# Patient Record
Sex: Female | Born: 1975 | Race: White | Hispanic: No | Marital: Married | Smoking: Never smoker
Health system: Southern US, Community
[De-identification: ages and names within clinical notes are randomized; demographics above are authoritative.]

---
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
## 2009-09-13 ENCOUNTER — Ambulatory Visit: Payer: Self-pay | Admitting: Family Medicine

## 2009-09-13 ENCOUNTER — Encounter: Admission: RE | Admit: 2009-09-13 | Discharge: 2009-09-13 | Payer: Self-pay | Admitting: Family Medicine

## 2009-09-13 DIAGNOSIS — R079 Chest pain, unspecified: Secondary | ICD-10-CM

## 2010-01-17 ENCOUNTER — Encounter: Payer: Self-pay | Admitting: Family Medicine

## 2010-02-06 NOTE — Assessment & Plan Note (Addendum)
Summary: NOV: Lump on right chest   Vital Signs:  Patient profile:   35 year old female Height:      65 inches Weight:      109 pounds BMI:     18.20 Pulse rate:   96 / minute BP sitting:   105 / 66  (right arm) Cuff size:   regular  Vitals Entered By: Avon Gully CMA, (AAMA) (September 13, 2009 11:00 AM) CC: NP est care   CC:  NP est care.  History of Present Illness: Her efor CPE. Has had a lump on her right rib that has been there for years.  Has noticed some occ pain in that area. No change in size or shape.  Has a baby 9 months ago and had normal labs at her 6 week postpartum check up.  Still nursing. On camila for birth control. No other lumps or rashes.  She had a friend get dx with lyphoma recently and this has made her more nervous about it.   Habits & Providers  Alcohol-Tobacco-Diet     Alcohol drinks/day: <1     Tobacco Status: never  Exercise-Depression-Behavior     Does Patient Exercise: no     STD Risk: never     Drug Use: no     Seat Belt Use: always  Past History:  Past Medical History: [facility]  Past Surgical History: Wisdom teeth removal 1997  Family History: MGM with thyroid CA Mother with derpession GM with hi chol HTN Mother with HTN  Social History: Homemaker. BA degree. Married to Loraine wtih 3 duaghters.  Never Smoked Alcohol use-yes Drug use-no Regular exercise-no Smoking Status:  never Does Patient Exercise:  no STD Risk:  never Drug Use:  no Seat Belt Use:  always  Review of Systems       No fever/sweats/weakness, unexplained weight loss/gain.  No vison changes.  No difficulty hearing/ringing in ears, hay fever/allergies.  No chest pain/discomfort, palpitations.  No Br lump/nipple discharge.  No cough/wheeze.  No blood in BM, nausea/vomiting/diarrhea.  No nighttime urination, leaking urine, unusual vaginal bleeding, discharge (penis or vagina).  No muscle/joint pain. No rash, change in mole.  No HA, memory loss.  No  anxiety, sleep d/o, depression.  No easy bruising/bleeding, + unexplained lump   Physical Exam  General:  Well-developed,well-nourished,in no acute distress; alert,appropriate and cooperative throughout examination Head:  Normocephalic and atraumatic without obvious abnormalities. No apparent alopecia or balding. Eyes:  No corneal or conjunctival inflammation noted. Abdomen:  soft, no masses, no guarding, no hepatomegaly, and no splenomegaly.  Does have a slight protrusion over the right rib area. It is not mobile and I really think it is the rib protruding. I don't feel a discrete mass like a LN or lipoma.     Impression & Recommendations:  Problem # 1:  CHEST PAIN (ICD-786.50) Will get xray to look at the rib area. conisder poorly healed rib fracture versus just normal anatomy or clacium build up. I really don't feel any soft tissue mass. If xray normal then recommend to monitor for change in size, shape or discomfort.  Orders: T-*Unlisted Diagnostic X-ray test/procedure [phone])  Other Orders: Flu Vaccine 83yrs + (52841) Admin 1st Vaccine (32440)   Immunizations Administered:  Influenza Vaccine # 1:    Vaccine Type: Fluvax 3+    Site: right deltoid    Mfr: Fluarix    Dose: 0.5 ml    Route: IM    Given by: Avon Gully  CMA, (AAMA)    Exp. Date: 07/07/2010    Lot #: [id number]    VIS given: 07/31/06 version given September 13, 2009.  Flu Vaccine Consent Questions:    Do you have a history of severe allergic reactions to this vaccine? no    Any prior history of allergic reactions to egg and/or gelatin? no    Do you have a sensitivity to the preservative Thimersol? no    Do you have a past history of Guillan-Barre Syndrome? no    Do you currently have an acute febrile illness? no    Have you ever had a severe reaction to latex? no    Vaccine information given and explained to patient? no    Are you currently pregnant? no   Appended Document: NOV: Lump on right chest Pap  smear normal - to return for  PAP in one year

## 2010-02-08 NOTE — Letter (Signed)
Summary: [facility]  [facility]   Imported By: Lanelle Bal 02/01/2010 10:31:14  _____________________________________________________________________  External Attachment:    Type:   Image     Comment:   External Document

## 2011-08-02 ENCOUNTER — Encounter: Payer: Self-pay | Admitting: Family Medicine

## 2011-08-02 ENCOUNTER — Ambulatory Visit (INDEPENDENT_AMBULATORY_CARE_PROVIDER_SITE_OTHER): Payer: Self-pay | Admitting: Family Medicine

## 2011-08-02 VITALS — BP 112/73 | HR 87 | Ht 65.0 in | Wt 114.0 lb

## 2011-08-02 DIAGNOSIS — Z0000 Encounter for general adult medical examination without abnormal findings: Secondary | ICD-10-CM

## 2011-08-02 DIAGNOSIS — Z23 Encounter for immunization: Secondary | ICD-10-CM

## 2011-08-02 NOTE — Patient Instructions (Addendum)
Start a regular exercise program and make sure you are eating a healthy diet Try to eat 4 servings of dairy a day  Your vaccines are up to date.   

## 2011-08-02 NOTE — Progress Notes (Signed)
  Subjective:    Patient ID: Marcia Lara, female    DOB: Feb 09, 1975, 36 y.o.   MRN: [id number]  HPI Here for CPE. No specific concerns.  Normal pap smears at [facility].  She is up-to-date on this. She does not note her last shot was. Her only prescription medication is birth control.   Review of Systems Comprehensive ROS is negative.   BP 112/73  Pulse 87  Ht 5\' 5"  (1.651 m)  Wt 114 lb (51.71 kg)  BMI 18.97 kg/m2  SpO2 100%    No Known Allergies  History reviewed. No pertinent past medical history.  Past Surgical History  Procedure Date  . Wisdom tooth extraction 1997    History   Social History  . Marital Status: Married    Spouse Name: N/A    Number of Children: N/A  . Years of Education: N/A   Occupational History  . Not on file.   Social History Main Topics  . Smoking status: Never Smoker   . Smokeless tobacco: Not on file  . Alcohol Use: Yes  . Drug Use: No  . Sexually Active: Not on file   Other Topics Concern  . Not on file   Social History Narrative  . No narrative on file    Family History  Problem Relation Age of Onset  . Thyroid cancer Maternal Grandmother   . Depression Mother   . Hypertension    . Hyperlipidemia      Outpatient Encounter Prescriptions as of 08/02/2011  Medication Sig Dispense Refill  . Norgestim-Eth Estrad Triphasic (TRI-SPRINTEC PO) Take by mouth.              Objective:   Physical Exam  BP 112/73  Pulse 87  Ht 5\' 5"  (1.651 m)  Wt 114 lb (51.71 kg)  BMI 18.97 kg/m2  SpO2 100% General appearance: alert, cooperative and appears stated age Head: Normocephalic, without obvious abnormality, atraumatic Eyes: conj clear, EOMi, PEERLA Ears: normal TM's and external ear canals both ears Nose: Nares normal. Septum midline. Mucosa normal. No drainage or sinus tenderness. Throat: lips, mucosa, and tongue normal; teeth and gums normal Neck: no adenopathy, no carotid bruit, no JVD, supple, symmetrical,  trachea midline and thyroid not enlarged, symmetric, no tenderness/mass/nodules Back: symmetric, no curvature. ROM normal. No CVA tenderness. Lungs: clear to auscultation bilaterally Heart: regular rate and rhythm, S1, S2 normal, no murmur, click, rub or gallop Abdomen: soft, non-tender; bowel sounds normal; no masses,  no organomegaly Extremities: extremities normal, atraumatic, no cyanosis or edema Pulses: 2+ and symmetric Skin: Skin color, texture, turgor normal. No rashes or lesions Lymph nodes: Cervical, supraclavicular, and axillary nodes normal. Neurologic: Alert and oriented X 3, normal strength and tone. Normal symmetric reflexes. Normal coordination and gait        Assessment & Plan:  CPE- BP looks great.   Start a regular exercise program and make sure you are eating a healthy diet Try to eat 4 servings of dairy a day  Your vaccines are up to date.  Due for fasting labwork.   Tdap given today.

## 2011-08-05 ENCOUNTER — Telehealth: Payer: Self-pay | Admitting: Family Medicine

## 2011-08-05 LAB — LIPID PANEL: Total CHOL/HDL Ratio: 2.6 Ratio

## 2011-08-05 LAB — COMPLETE METABOLIC PANEL WITH GFR
Albumin: 4.3 g/dL (ref 3.5–5.2)
Alkaline Phosphatase: 43 U/L (ref 39–117)
GFR, Est Non African American: >89 mL/min
Glucose, Bld: 90 mg/dL (ref 70–99)
Potassium: 4.2 mEq/L (ref 3.5–5.3)
Sodium: 140 mEq/L (ref 135–145)
Total Protein: 6.8 g/dL (ref 6.0–8.3)

## 2011-08-05 NOTE — Telephone Encounter (Signed)
Patient states she was here Friday7-26-13 and Dr. Linford Arnold told her she would give her a Derm. Referral and I told patient I would check on that and she will probably hear from our office this week. Please advise. Thanks, [employer]

## 2011-08-06 NOTE — Progress Notes (Signed)
Quick Note:  All labs are normal. ______ 

## 2011-08-07 ENCOUNTER — Other Ambulatory Visit: Payer: Self-pay | Admitting: Family Medicine

## 2011-08-07 DIAGNOSIS — D229 Melanocytic nevi, unspecified: Secondary | ICD-10-CM

## 2016-06-04 ENCOUNTER — Ambulatory Visit (INDEPENDENT_AMBULATORY_CARE_PROVIDER_SITE_OTHER): Payer: 59 | Admitting: Physician Assistant

## 2016-06-04 ENCOUNTER — Encounter: Payer: Self-pay | Admitting: Physician Assistant

## 2016-06-04 VITALS — BP 118/72 | HR 110 | Ht 65.0 in | Wt 115.0 lb

## 2016-06-04 DIAGNOSIS — Z0000 Encounter for general adult medical examination without abnormal findings: Secondary | ICD-10-CM | POA: Diagnosis not present

## 2016-06-04 DIAGNOSIS — R221 Localized swelling, mass and lump, neck: Secondary | ICD-10-CM | POA: Diagnosis not present

## 2016-06-04 DIAGNOSIS — Z13220 Encounter for screening for lipoid disorders: Secondary | ICD-10-CM

## 2016-06-04 DIAGNOSIS — R253 Fasciculation: Secondary | ICD-10-CM

## 2016-06-04 DIAGNOSIS — Z131 Encounter for screening for diabetes mellitus: Secondary | ICD-10-CM

## 2016-06-04 NOTE — Patient Instructions (Signed)
Blepharospasm Blepharospasm is a sudden tightening (spasm) of the muscles around your eyes (orbicularis oculi). It causes attacks of abnormal and uncontrollable blinking that come and go without warning. This type of abnormal muscle movement is called dystonia. Dystonia usually affects both eyes, but it does not affect other facial muscles or other parts of the body. Blepharospasm does not cause vision loss or lead to other serious physical problems. What are the causes? Blepharospasm is caused by an abnormality in a part of your brain that is called the basal ganglion. The exact cause of the abnormality is not known. Stress, fatigue, eye irritation, or bright light may trigger attacks of blepharospasm. What increases the risk? You may be at greater risk for blepharospasm if you:  Have a family history of blepharospasm.  Are female.  Are 54-39 years old. What are the signs or symptoms? Eye irritation and dryness are the first symptoms of blepharospasm. Your eyes may also feel tired or irritated when they are exposed to bright lights. As blepharospasm gets worse, uncontrollable blinking becomes more frequent. Other eye symptoms may include:  Uncontrolled and tight closing.  Watering.  Dryness.  Eye muscle pain.  Gritty sensation.  Sensitivity to light. How is this diagnosed? Your health care provider may diagnose blepharospasm based on your symptoms and your medical history. Your health care provider may also do a physical exam to confirm the diagnosis. There are no blood tests or other tests to help diagnose blepharospasm. How is this treated? There is no cure for blepharospasm. Treatments that are used to manage the condition may include:  Botulinum toxin injections. Botulinum toxin is a substance that is produced by bacteria that cause muscle paralysis. Injecting this toxin into the muscles around the eye may control blepharospasm for up to three months. Injections are done with a  tiny needle. They can be repeated as needed.  Medicines. These include muscle relaxants, sedatives, and medicines that are called anticholinergics. Treatment with medicine is less successful than using injections.  Surgery. If other treatments have not worked, you may need surgery to remove part of the orbicularis oculi muscles (myectomy). Follow these instructions at home: Several home care management strategies may help. You may have to try different techniques to find what works best for you. These may include:  Avoiding triggers that may bring on your attacks.  Resting and avoiding stress as much as possible.  Applying gentle pressure to the side of your eye or face. Sometimes this can stop an attack.  Doing a specific activity that can halt an attack. Examples include laughing, singing, yawning, and chewing.  Wearing dark glasses and a sun visor to protect your eyes from bright light.  Trying alternative treatments such as acupuncture, biofeedback, hypnosis, or meditation.  Going to sleep or taking a nap. Blepharospasm usually stops during sleep. Contact a health care provider if:  Your attacks get worse or happen more often.  You are anxious or depressed because of your attacks. This information is not intended to replace advice given to you by your health care provider. Make sure you discuss any questions you have with your health care provider. Document Released: 12/27/2002 Document Revised: 06/01/2015 Document Reviewed: 08/17/2013 Elsevier Interactive Patient Education  2017 [employer].

## 2016-06-04 NOTE — Progress Notes (Signed)
Subjective:     Marcia Lara is a 41 y.o. female and is here for a comprehensive physical exam. The patient reports problems - she is having some right eye twitching for last 3-4 months. she went to eye doctor and was determined she needs glasses. she has not gotten them yet. seems to improve with rest. she also has a non tender lump on posterior right neck. she is concerned this could be something. .  Social History   Social History  . Marital status: Married    Spouse name: N/A  . Number of children: 3  . Years of education: N/A   Occupational History  . Not on file.   Social History Main Topics  . Smoking status: Never Smoker  . Smokeless tobacco: Never Used  . Alcohol use Yes  . Drug use: No  . Sexual activity: Yes    Partners: Male   Other Topics Concern  . Not on file   Social History Narrative  . No narrative on file   Health Maintenance  Topic Date Due  . PAP SMEAR  02/12/2014  . HIV Screening  06/04/2017 (Originally 03/28/1992)  . INFLUENZA VACCINE  08/07/2016  . TETANUS/TDAP  08/01/2021    The following portions of the patient's history were reviewed and updated as appropriate: allergies, current medications, past family history, past medical history, past social history, past surgical history and problem list.  Review of Systems Pertinent items noted in HPI and remainder of comprehensive ROS otherwise negative.   Objective:    BP 118/72   Pulse (!) 110   Ht 5\' 5"  (1.651 m)   Wt 115 lb (52.2 kg)   BMI 19.14 kg/m  General appearance: alert, cooperative and appears stated age Head: Normocephalic, without obvious abnormality, atraumatic Eyes: conjunctivae/corneas clear. PERRL, EOM's intact. Fundi benign. Ears: normal TM's and external ear canals both ears Nose: Nares normal. Septum midline. Mucosa normal. No drainage or sinus tenderness. Throat: lips, mucosa, and tongue normal; teeth and gums normal Neck: no carotid bruit, no JVD, supple,  symmetrical, trachea midline, thyroid not enlarged, symmetric, no tenderness/mass/nodules and pea sized nodule of posterior right side of neck that is non-tender, mobile.  Back: symmetric, no curvature. ROM normal. No CVA tenderness. Lungs: clear to auscultation bilaterally Heart: regular rate and rhythm, S1, S2 normal, no murmur, click, rub or gallop Abdomen: soft, non-tender; bowel sounds normal; no masses,  no organomegaly Extremities: extremities normal, atraumatic, no cyanosis or edema Pulses: 2+ and symmetric Skin: Skin color, texture, turgor normal. No rashes or lesions Lymph nodes: Cervical, supraclavicular, and axillary nodes normal. Neurologic: Alert and oriented X 3, normal strength and tone. Normal symmetric reflexes. Normal coordination and gait noticeable right eye twitch.    Assessment:    Healthy female exam.      Plan:     Marland KitchenMarland KitchenGaelle was seen today for establish care and annual exam.  Diagnoses and all orders for this visit:  Routine physical examination -     Lipid panel -     COMPLETE METABOLIC PANEL WITH GFR -     TSH -     VITAMIN D 25 Hydroxy (Vit-D Deficiency, Fractures) -     CBC with Differential/Platelet -     B12  Screening for lipid disorders -     Lipid panel  Screening for diabetes mellitus -     COMPLETE METABOLIC PANEL WITH GFR  Eye muscle twitches -     TSH -     VITAMIN  D 25 Hydroxy (Vit-D Deficiency, Fractures) -     CBC with Differential/Platelet -     B12  Lump on neck -     US SOFT TISSUE HEAD AND NECK; Future   Give glasses a chance to help with eye strain. Follow up as needed. HO given.  Ordered labs to do full work up on eye twitching, health prevention, and lump on neck.    Signed to release records from [facility] for pap.  See After Visit Summary for Counseling Recommendations

## 2016-06-05 ENCOUNTER — Ambulatory Visit (INDEPENDENT_AMBULATORY_CARE_PROVIDER_SITE_OTHER): Payer: 59

## 2016-06-05 DIAGNOSIS — R221 Localized swelling, mass and lump, neck: Secondary | ICD-10-CM

## 2016-06-05 LAB — COMPLETE METABOLIC PANEL WITH GFR
ALT: 10 U/L (ref 6–29)
AST: 13 U/L (ref 10–30)
Albumin: 4.2 g/dL (ref 3.6–5.1)
Alkaline Phosphatase: 41 U/L (ref 33–115)
BILIRUBIN TOTAL: 0.7 mg/dL (ref 0.2–1.2)
BUN: 13 mg/dL (ref 7–25)
CHLORIDE: 103 mmol/L (ref 98–110)
CO2: 26 mmol/L (ref 20–31)
Calcium: 9.6 mg/dL (ref 8.6–10.2)
Creat: 0.9 mg/dL (ref 0.50–1.10)
GFR, EST NON AFRICAN AMERICAN: 81 mL/min (ref >=60)
Glucose, Bld: 94 mg/dL (ref 65–99)
POTASSIUM: 4.2 mmol/L (ref 3.5–5.3)
Sodium: 141 mmol/L (ref 135–146)
TOTAL PROTEIN: 7 g/dL (ref 6.1–8.1)

## 2016-06-05 LAB — LIPID PANEL
CHOL/HDL RATIO: 2 ratio (ref <5.0)
Cholesterol: 135 mg/dL (ref <200)
HDL: 67 mg/dL (ref >50)
LDL CALC: 47 mg/dL (ref <100)
TRIGLYCERIDES: 106 mg/dL (ref <150)
VLDL: 21 mg/dL (ref <30)

## 2016-06-05 LAB — TSH: TSH: 1.79 mIU/L

## 2016-06-05 NOTE — Progress Notes (Signed)
Call pt: benign appearing subcutaneous lymph node. Will continue to monitor if size increasing then we can re-scan in the future.

## 2016-06-06 LAB — CBC WITH DIFFERENTIAL/PLATELET

## 2016-06-06 LAB — VITAMIN B12: VITAMIN B 12: 472 pg/mL (ref 200–1100)

## 2016-06-06 LAB — VITAMIN D 25 HYDROXY (VIT D DEFICIENCY, FRACTURES): VIT D 25 HYDROXY: 40 ng/mL (ref 30–100)

## 2018-01-20 ENCOUNTER — Encounter: Payer: Self-pay | Admitting: Physician Assistant

## 2018-01-20 ENCOUNTER — Ambulatory Visit (INDEPENDENT_AMBULATORY_CARE_PROVIDER_SITE_OTHER): Payer: 59 | Admitting: Physician Assistant

## 2018-01-20 VITALS — BP 129/74 | HR 97 | Ht 65.0 in | Wt 111.0 lb

## 2018-01-20 DIAGNOSIS — Z0000 Encounter for general adult medical examination without abnormal findings: Secondary | ICD-10-CM

## 2018-01-20 DIAGNOSIS — Z13220 Encounter for screening for lipoid disorders: Secondary | ICD-10-CM | POA: Diagnosis not present

## 2018-01-20 DIAGNOSIS — Z131 Encounter for screening for diabetes mellitus: Secondary | ICD-10-CM | POA: Diagnosis not present

## 2018-01-20 LAB — LIPID PANEL W/REFLEX DIRECT LDL
CHOL/HDL RATIO: 2.1 (calc) (ref <5.0)
Cholesterol: 146 mg/dL (ref <200)
HDL: 69 mg/dL (ref >50)
LDL CHOLESTEROL (CALC): 61 mg/dL
Non-HDL Cholesterol (Calc): 77 mg/dL (calc) (ref <130)
TRIGLYCERIDES: 77 mg/dL (ref <150)

## 2018-01-20 LAB — COMPLETE METABOLIC PANEL WITH GFR
AG Ratio: 1.7 (calc) (ref 1.0–2.5)
ALBUMIN MSPROF: 4.3 g/dL (ref 3.6–5.1)
ALKALINE PHOSPHATASE (APISO): 41 U/L (ref 33–115)
ALT: 11 U/L (ref 6–29)
AST: 16 U/L (ref 10–30)
BILIRUBIN TOTAL: 0.8 mg/dL (ref 0.2–1.2)
BUN: 14 mg/dL (ref 7–25)
CHLORIDE: 101 mmol/L (ref 98–110)
CO2: 29 mmol/L (ref 20–32)
Calcium: 9.4 mg/dL (ref 8.6–10.2)
Creat: 0.92 mg/dL (ref 0.50–1.10)
GFR, Est African American: 90 mL/min/{1.73_m2} (ref >=60)
GFR, Est Non African American: 78 mL/min/{1.73_m2} (ref >=60)
GLOBULIN: 2.6 g/dL (ref 1.9–3.7)
GLUCOSE: 83 mg/dL (ref 65–99)
Potassium: 4.5 mmol/L (ref 3.5–5.3)
SODIUM: 139 mmol/L (ref 135–146)
Total Protein: 6.9 g/dL (ref 6.1–8.1)

## 2018-01-20 NOTE — Patient Instructions (Signed)
Vitamin D 1000 units once a day.  Make sure getting 4 servings of diary   Health Maintenance, Female Adopting a healthy lifestyle and getting preventive care can go a long way to promote health and wellness. Talk with your health care provider about what schedule of regular examinations is right for you. This is a good chance for you to check in with your provider about disease prevention and staying healthy. In between checkups, there are plenty of things you can do on your own. Experts have done a lot of research about which lifestyle changes and preventive measures are most likely to keep you healthy. Ask your health care provider for more information. Weight and diet Eat a healthy diet  Be sure to include plenty of vegetables, fruits, low-fat dairy products, and lean protein.  Do not eat a lot of foods high in solid fats, added sugars, or salt.  Get regular exercise. This is one of the most important things you can do for your health. ? Most adults should exercise for at least 150 minutes each week. The exercise should increase your heart rate and make you sweat (moderate-intensity exercise). ? Most adults should also do strengthening exercises at least twice a week. This is in addition to the moderate-intensity exercise. Maintain a healthy weight  Body mass index (BMI) is a measurement that can be used to identify possible weight problems. It estimates body fat based on height and weight. Your health care provider can help determine your BMI and help you achieve or maintain a healthy weight.  For females 26 years of age and older: ? A BMI below 18.5 is considered underweight. ? A BMI of 18.5 to 24.9 is normal. ? A BMI of 25 to 29.9 is considered overweight. ? A BMI of 30 and above is considered obese. Watch levels of cholesterol and blood lipids  You should start having your blood tested for lipids and cholesterol at 41 years of age, then have this test every 5 years.  You may need  to have your cholesterol levels checked more often if: ? Your lipid or cholesterol levels are high. ? You are older than 41 years of age. ? You are at high risk for heart disease. Cancer screening Lung Cancer  Lung cancer screening is recommended for adults 68-23 years old who are at high risk for lung cancer because of a history of smoking.  A yearly low-dose CT scan of the lungs is recommended for people who: ? Currently smoke. ? Have quit within the past 15 years. ? Have at least a 30-pack-year history of smoking. A pack year is smoking an average of one pack of cigarettes a day for 1 year.  Yearly screening should continue until it has been 15 years since you quit.  Yearly screening should stop if you develop a health problem that would prevent you from having lung cancer treatment. Breast Cancer  Practice breast self-awareness. This means understanding how your breasts normally appear and feel.  It also means doing regular breast self-exams. Let your health care provider know about any changes, no matter how small.  If you are in your 20s or 30s, you should have a clinical breast exam (CBE) by a health care provider every 1-3 years as part of a regular health exam.  If you are 56 or older, have a CBE every year. Also consider having a breast X-ray (mammogram) every year.  If you have a family history of breast cancer, talk to your  health care provider about genetic screening.  If you are at high risk for breast cancer, talk to your health care provider about having an MRI and a mammogram every year.  Breast cancer gene (BRCA) assessment is recommended for women who have family members with BRCA-related cancers. BRCA-related cancers include: ? Breast. ? Ovarian. ? Tubal. ? Peritoneal cancers.  Results of the assessment will determine the need for genetic counseling and BRCA1 and BRCA2 testing. Cervical Cancer Your health care provider may recommend that you be screened  regularly for cancer of the pelvic organs (ovaries, uterus, and vagina). This screening involves a pelvic examination, including checking for microscopic changes to the surface of your cervix (Pap test). You may be encouraged to have this screening done every 3 years, beginning at age 76.  For women ages 70-65, health care providers may recommend pelvic exams and Pap testing every 3 years, or they may recommend the Pap and pelvic exam, combined with testing for human papilloma virus (HPV), every 5 years. Some types of HPV increase your risk of cervical cancer. Testing for HPV may also be done on women of any age with unclear Pap test results.  Other health care providers may not recommend any screening for nonpregnant women who are considered low risk for pelvic cancer and who do not have symptoms. Ask your health care provider if a screening pelvic exam is right for you.  If you have had past treatment for cervical cancer or a condition that could lead to cancer, you need Pap tests and screening for cancer for at least 20 years after your treatment. If Pap tests have been discontinued, your risk factors (such as having a new sexual partner) need to be reassessed to determine if screening should resume. Some women have medical problems that increase the chance of getting cervical cancer. In these cases, your health care provider may recommend more frequent screening and Pap tests. Colorectal Cancer  This type of cancer can be detected and often prevented.  Routine colorectal cancer screening usually begins at 41 years of age and continues through 41 years of age.  Your health care provider may recommend screening at an earlier age if you have risk factors for colon cancer.  Your health care provider may also recommend using home test kits to check for hidden blood in the stool.  A small camera at the end of a tube can be used to examine your colon directly (sigmoidoscopy or colonoscopy). This is  done to check for the earliest forms of colorectal cancer.  Routine screening usually begins at age 36.  Direct examination of the colon should be repeated every 5-10 years through 41 years of age. However, you may need to be screened more often if early forms of precancerous polyps or small growths are found. Skin Cancer  Check your skin from head to toe regularly.  Tell your health care provider about any new moles or changes in moles, especially if there is a change in a mole's shape or color.  Also tell your health care provider if you have a mole that is larger than the size of a pencil eraser.  Always use sunscreen. Apply sunscreen liberally and repeatedly throughout the day.  Protect yourself by wearing long sleeves, pants, a wide-brimmed hat, and sunglasses whenever you are outside. Heart disease, diabetes, and high blood pressure  High blood pressure causes heart disease and increases the risk of stroke. High blood pressure is more likely to develop in: ? People  who have blood pressure in the high end of the normal range (130-139/85-89 mm Hg). ? People who are overweight or obese. ? People who are African American.  If you are 75-52 years of age, have your blood pressure checked every 3-5 years. If you are 47 years of age or older, have your blood pressure checked every year. You should have your blood pressure measured twice-once when you are at a hospital or clinic, and once when you are not at a hospital or clinic. Record the average of the two measurements. To check your blood pressure when you are not at a hospital or clinic, you can use: ? An automated blood pressure machine at a pharmacy. ? A home blood pressure monitor.  If you are between 50 years and 23 years old, ask your health care provider if you should take aspirin to prevent strokes.  Have regular diabetes screenings. This involves taking a blood sample to check your fasting blood sugar level. ? If you are at a  normal weight and have a low risk for diabetes, have this test once every three years after 41 years of age. ? If you are overweight and have a high risk for diabetes, consider being tested at a younger age or more often. Preventing infection Hepatitis B  If you have a higher risk for hepatitis B, you should be screened for this virus. You are considered at high risk for hepatitis B if: ? You were born in a country where hepatitis B is common. Ask your health care provider which countries are considered high risk. ? Your parents were born in a high-risk country, and you have not been immunized against hepatitis B (hepatitis B vaccine). ? You have HIV or AIDS. ? You use needles to inject street drugs. ? You live with someone who has hepatitis B. ? You have had sex with someone who has hepatitis B. ? You get hemodialysis treatment. ? You take certain medicines for conditions, including cancer, organ transplantation, and autoimmune conditions. Hepatitis C  Blood testing is recommended for: ? Everyone born from [birth year] through 1963. ? Anyone with known risk factors for hepatitis C. Sexually transmitted infections (STIs)  You should be screened for sexually transmitted infections (STIs) including gonorrhea and chlamydia if: ? You are sexually active and are younger than 41 years of age. ? You are older than 41 years of age and your health care provider tells you that you are at risk for this type of infection. ? Your sexual activity has changed since you were last screened and you are at an increased risk for chlamydia or gonorrhea. Ask your health care provider if you are at risk.  If you do not have HIV, but are at risk, it may be recommended that you take a prescription medicine daily to prevent HIV infection. This is called pre-exposure prophylaxis (PrEP). You are considered at risk if: ? You are sexually active and do not regularly use condoms or know the HIV status of your partner(s). ? You  take drugs by injection. ? You are sexually active with a partner who has HIV. Talk with your health care provider about whether you are at high risk of being infected with HIV. If you choose to begin PrEP, you should first be tested for HIV. You should then be tested every 3 months for as long as you are taking PrEP. Pregnancy  If you are premenopausal and you may become pregnant, ask your health care provider about preconception  counseling.  If you may become pregnant, take 400 to 800 micrograms (mcg) of folic acid every day.  If you want to prevent pregnancy, talk to your health care provider about birth control (contraception). Osteoporosis and menopause  Osteoporosis is a disease in which the bones lose minerals and strength with aging. This can result in serious bone fractures. Your risk for osteoporosis can be identified using a bone density scan.  If you are 88 years of age or older, or if you are at risk for osteoporosis and fractures, ask your health care provider if you should be screened.  Ask your health care provider whether you should take a calcium or vitamin D supplement to lower your risk for osteoporosis.  Menopause may have certain physical symptoms and risks.  Hormone replacement therapy may reduce some of these symptoms and risks. Talk to your health care provider about whether hormone replacement therapy is right for you. Follow these instructions at home:  Schedule regular health, dental, and eye exams.  Stay current with your immunizations.  Do not use any tobacco products including cigarettes, chewing tobacco, or electronic cigarettes.  If you are pregnant, do not drink alcohol.  If you are breastfeeding, limit how much and how often you drink alcohol.  Limit alcohol intake to no more than 1 drink per day for nonpregnant women. One drink equals 12 ounces of beer, 5 ounces of wine, or 1 ounces of hard liquor.  Do not use street drugs.  Do not share  needles.  Ask your health care provider for help if you need support or information about quitting drugs.  Tell your health care provider if you often feel depressed.  Tell your health care provider if you have ever been abused or do not feel safe at home. This information is not intended to replace advice given to you by your health care provider. Make sure you discuss any questions you have with your health care provider. Document Released: 07/09/2010 Document Revised: 06/01/2015 Document Reviewed: 09/27/2014 Elsevier Interactive Patient Education  2019 [employer].

## 2018-01-20 NOTE — Progress Notes (Signed)
Subjective:     Marcia Lara is a 43 y.o. female and is here for a comprehensive physical exam. The patient reports no problems.  Social History   Socioeconomic History  . Marital status: Married    Spouse name: Not on file  . Number of children: 3  . Years of education: Not on file  . Highest education level: Not on file  Occupational History  . Not on file  Social Needs  . Financial resource strain: Not on file  . Food insecurity:    Worry: Not on file    Inability: Not on file  . Transportation needs:    Medical: Not on file    Non-medical: Not on file  Tobacco Use  . Smoking status: Never Smoker  . Smokeless tobacco: Never Used  Substance and Sexual Activity  . Alcohol use: Yes  . Drug use: No  . Sexual activity: Yes    Partners: Male  Lifestyle  . Physical activity:    Days per week: Not on file    Minutes per session: Not on file  . Stress: Not on file  Relationships  . Social connections:    Talks on phone: Not on file    Gets together: Not on file    Attends religious service: Not on file    Active member of club or organization: Not on file    Attends meetings of clubs or organizations: Not on file    Relationship status: Not on file  . Intimate partner violence:    Fear of current or ex partner: Not on file    Emotionally abused: Not on file    Physically abused: Not on file    Forced sexual activity: Not on file  Other Topics Concern  . Not on file  Social History Narrative  . Not on file   Health Maintenance  Topic Date Due  . HIV Screening  03/28/1992  . PAP SMEAR-Modifier  02/12/2014  . TETANUS/TDAP  08/01/2021  . INFLUENZA VACCINE  Completed    The following portions of the patient's history were reviewed and updated as appropriate: allergies, current medications, past family history, past medical history, past social history, past surgical history and problem list.  Review of Systems A comprehensive review of systems was  negative.   Objective:    BP 129/74   Pulse 97   Ht 5\' 5"  (1.651 m)   Wt 111 lb (50.3 kg)   BMI 18.47 kg/m  General appearance: alert, cooperative and appears stated age Head: Normocephalic, without obvious abnormality, atraumatic Eyes: conjunctivae/corneas clear. PERRL, EOM's intact. Fundi benign. Ears: normal TM's and external ear canals both ears Nose: Nares normal. Septum midline. Mucosa normal. No drainage or sinus tenderness. Throat: lips, mucosa, and tongue normal; teeth and gums normal Neck: no adenopathy, no carotid bruit, no JVD, supple, symmetrical, trachea midline and thyroid not enlarged, symmetric, no tenderness/mass/nodules Back: symmetric, no curvature. ROM normal. No CVA tenderness. Lungs: clear to auscultation bilaterally Heart: regular rate and rhythm, S1, S2 normal, no murmur, click, rub or gallop Abdomen: soft, non-tender; bowel sounds normal; no masses,  no organomegaly Extremities: extremities normal, atraumatic, no cyanosis or edema Pulses: 2+ and symmetric Skin: Skin color, texture, turgor normal. No rashes or lesions Lymph nodes: Cervical, supraclavicular, and axillary nodes normal. Neurologic: Alert and oriented X 3, normal strength and tone. Normal symmetric reflexes. Normal coordination and gait    Assessment:    Healthy female exam.      Plan:    Marland KitchenMarland Kitchen  Marcia Lara was seen today for annual exam.  Diagnoses and all orders for this visit:  Routine physical examination -     Lipid Panel w/reflex Direct LDL -     COMPLETE METABOLIC PANEL WITH GFR  Screening for diabetes mellitus -     COMPLETE METABOLIC PANEL WITH GFR  Screening for lipid disorders -     Lipid Panel w/reflex Direct LDL   .[name] Depression screen [facility] 2/9 01/20/2018 06/04/2016  Decreased Interest 0 0  Down, Depressed, Hopeless 0 0  PHQ - 2 Score 0 0   .[name] Discussed 150 minutes of exercise a week.  Encouraged vitamin D 1000 units and Calcium 1300mg  or 4 servings of dairy a day.  Pt is  due for pap and will get next month.  Discussed guidelines for mammogram at 44 or low risk 50. She is low risk and will consider waiting and consulting GYN.  Fasting labs ordered.  Vaccines up to date STD screening declined.  See After Visit Summary for Counseling Recommendations

## 2018-01-21 NOTE — Progress Notes (Signed)
Call pt: labs are fantastic.   Please record on her biometric screening form and fax to company as well as leave a copy for the patient.

## 2018-02-27 LAB — HM PAP SMEAR: HM Pap smear: NEGATIVE

## 2019-01-27 ENCOUNTER — Ambulatory Visit (INDEPENDENT_AMBULATORY_CARE_PROVIDER_SITE_OTHER): Payer: 59 | Admitting: Physician Assistant

## 2019-01-27 ENCOUNTER — Other Ambulatory Visit: Payer: Self-pay

## 2019-01-27 ENCOUNTER — Encounter: Payer: Self-pay | Admitting: Physician Assistant

## 2019-01-27 VITALS — BP 115/70 | HR 89 | Ht 65.0 in | Wt 112.0 lb

## 2019-01-27 DIAGNOSIS — Z131 Encounter for screening for diabetes mellitus: Secondary | ICD-10-CM | POA: Diagnosis not present

## 2019-01-27 DIAGNOSIS — Z0000 Encounter for general adult medical examination without abnormal findings: Secondary | ICD-10-CM

## 2019-01-27 DIAGNOSIS — R310 Gross hematuria: Secondary | ICD-10-CM

## 2019-01-27 DIAGNOSIS — Z13220 Encounter for screening for lipoid disorders: Secondary | ICD-10-CM

## 2019-01-27 LAB — POCT URINALYSIS DIP (CLINITEK)
Bilirubin, UA: NEGATIVE
Blood, UA: NEGATIVE
Glucose, UA: NEGATIVE mg/dL
Ketones, POC UA: NEGATIVE mg/dL
Leukocytes, UA: NEGATIVE
Nitrite, UA: NEGATIVE
POC PROTEIN,UA: NEGATIVE
Spec Grav, UA: 1.025 (ref 1.010–1.025)
Urobilinogen, UA: 0.2 E.U./dL
pH, UA: 7.5 (ref 5.0–8.0)

## 2019-01-27 NOTE — Progress Notes (Signed)
Subjective:     Marcia Lara is a 44 y.o. female and is here for a comprehensive physical exam. The patient reports problems - see below.  Pt has had 2 episodes of sudden dysuria/lower abdominal and left flank pain with noticeable blood in urine that last for 24 hours and then suddenly resolves. She has called telemedicine and given Abx but she does not think infection. No ongoing problems. Mother has hx of kidney stones. She has never been dx.   Social History   Socioeconomic History  . Marital status: Married    Spouse name: Not on file  . Number of children: 3  . Years of education: Not on file  . Highest education level: Not on file  Occupational History  . Not on file  Tobacco Use  . Smoking status: Never Smoker  . Smokeless tobacco: Never Used  Substance and Sexual Activity  . Alcohol use: Yes  . Drug use: No  . Sexual activity: Yes    Partners: Male  Other Topics Concern  . Not on file  Social History Narrative  . Not on file   Social Determinants of Health   Financial Resource Strain:   . Difficulty of Paying Living Expenses: Not on file  Food Insecurity:   . Worried About Charity fundraiser in the Last Year: Not on file  . Ran Out of Food in the Last Year: Not on file  Transportation Needs:   . Lack of Transportation (Medical): Not on file  . Lack of Transportation (Non-Medical): Not on file  Physical Activity:   . Days of Exercise per Week: Not on file  . Minutes of Exercise per Session: Not on file  Stress:   . Feeling of Stress : Not on file  Social Connections:   . Frequency of Communication with Friends and Family: Not on file  . Frequency of Social Gatherings with Friends and Family: Not on file  . Attends Religious Services: Not on file  . Active Member of Clubs or Organizations: Not on file  . Attends Archivist Meetings: Not on file  . Marital Status: Not on file  Intimate Partner Violence:   . Fear of Current or Ex-Partner: Not  on file  . Emotionally Abused: Not on file  . Physically Abused: Not on file  . Sexually Abused: Not on file   Health Maintenance  Topic Date Due  . HIV Screening  01/27/2020 (Originally [birth year]/28/1992)  . PAP SMEAR-Modifier  02/07/2021  . TETANUS/TDAP  10/20/2023  . INFLUENZA VACCINE  Completed    The following portions of the patient's history were reviewed and updated as appropriate: allergies, current medications, past family history, past medical history, past social history, past surgical history and problem list.  Review of Systems Pertinent items noted in HPI and remainder of comprehensive ROS otherwise negative.   Objective:    BP 115/70   Pulse 89   Ht 5\' 5"  (1.651 m)   Wt 112 lb (50.8 kg)   SpO2 100%   BMI 18.64 kg/m  General appearance: alert, cooperative and appears stated age Head: Normocephalic, without obvious abnormality, atraumatic Eyes: conjunctivae/corneas clear. PERRL, EOM's intact. Fundi benign. Ears: normal TM's and external ear canals both ears Nose: Nares normal. Septum midline. Mucosa normal. No drainage or sinus tenderness. Throat: lips, mucosa, and tongue normal; teeth and gums normal Neck: no adenopathy, no carotid bruit, no JVD, supple, symmetrical, trachea midline and thyroid not enlarged, symmetric, no tenderness/mass/nodules Back: symmetric, no curvature.  ROM normal. No CVA tenderness. Lungs: clear to auscultation bilaterally Heart: regular rate and rhythm, S1, S2 normal, no murmur, click, rub or gallop Abdomen: soft, non-tender; bowel sounds normal; no masses,  no organomegaly Extremities: extremities normal, atraumatic, no cyanosis or edema Pulses: 2+ and symmetric Skin: Skin color, texture, turgor normal. No rashes or lesions Lymph nodes: Cervical, supraclavicular, and axillary nodes normal. Neurologic: Alert and oriented X 3, normal strength and tone. Normal symmetric reflexes. Normal coordination and gait    Assessment:    Healthy female  exam.      Plan:    Marland KitchenMarland KitchenHartlyn was seen today for annual exam.  Diagnoses and all orders for this visit:  Routine physical examination -     COMPLETE METABOLIC PANEL WITH GFR -     Hemoglobin A1c -     Lipid Panel w/reflex Direct LDL  Screening for diabetes mellitus -     COMPLETE METABOLIC PANEL WITH GFR -     Hemoglobin A1c  Screening for lipid disorders -     Lipid Panel w/reflex Direct LDL  Gross hematuria -     POCT URINALYSIS DIP (CLINITEK)  .[name] Depression screen [facility] 2/9 01/27/2019 01/20/2018 06/04/2016  Decreased Interest 0 0 0  Down, Depressed, Hopeless 0 0 0  PHQ - 2 Score 0 0 0  Altered sleeping 0 - -  Tired, decreased energy 1 - -  Change in appetite 0 - -  Feeling bad or failure about yourself  0 - -  Trouble concentrating 0 - -  Moving slowly or fidgety/restless 0 - -  Suicidal thoughts 0 - -  PHQ-9 Score 1 - -  Difficult doing work/chores Not difficult at all - -   .[name] Results for orders placed or performed in visit on 01/27/19  POCT URINALYSIS DIP (CLINITEK)  Result Value Ref Range   Color, UA yellow yellow   Clarity, UA clear clear   Glucose, UA negative negative mg/dL   Bilirubin, UA negative negative   Ketones, POC UA negative negative mg/dL   Spec Grav, UA 1.025 1.010 - 1.025   Blood, UA negative negative   pH, UA 7.5 5.0 - 8.0   POC PROTEIN,UA negative negative, trace   Urobilinogen, UA 0.2 0.2 or 1.0 E.U./dL   Nitrite, UA Negative Negative   Leukocytes, UA Negative Negative    Discussed 150 minutes of exercise a week.  Encouraged vitamin D 1000 units and Calcium 1300mg  or 4 servings of dairy a day.  Needs mammogram. States will get with GYN. Pap UTD. Vaccines UTD.  Fasting labs ordered.   2 episodes of sudden dysuria/abdominal and flank pain/blood in urine. UA today no blood, leuks, nitrates. Reassured patient.  Sounds like kidney stone. Discussed prevention. Come into office next time she has symptoms.  See After Visit Summary for  Counseling Recommendations

## 2019-01-27 NOTE — Patient Instructions (Addendum)
Health Maintenance, Female Adopting a healthy lifestyle and getting preventive care are important in promoting health and wellness. Ask your health care provider about:  The right schedule for you to have regular tests and exams.  Things you can do on your own to prevent diseases and keep yourself healthy. What should I know about diet, weight, and exercise? Eat a healthy diet   Eat a diet that includes plenty of vegetables, fruits, low-fat dairy products, and lean protein.  Do not eat a lot of foods that are high in solid fats, added sugars, or sodium. Maintain a healthy weight Body mass index (BMI) is used to identify weight problems. It estimates body fat based on height and weight. Your health care provider can help determine your BMI and help you achieve or maintain a healthy weight. Get regular exercise Get regular exercise. This is one of the most important things you can do for your health. Most adults should:  Exercise for at least 150 minutes each week. The exercise should increase your heart rate and make you sweat (moderate-intensity exercise).  Do strengthening exercises at least twice a week. This is in addition to the moderate-intensity exercise.  Spend less time sitting. Even light physical activity can be beneficial. Watch cholesterol and blood lipids Have your blood tested for lipids and cholesterol at 42 years of age, then have this test every 5 years. Have your cholesterol levels checked more often if:  Your lipid or cholesterol levels are high.  You are older than 42 years of age.  You are at high risk for heart disease. What should I know about cancer screening? Depending on your health history and family history, you may need to have cancer screening at various ages. This may include screening for:  Breast cancer.  Cervical cancer.  Colorectal cancer.  Skin cancer.  Lung cancer. What should I know about heart disease, diabetes, and high blood  pressure? Blood pressure and heart disease  High blood pressure causes heart disease and increases the risk of stroke. This is more likely to develop in people who have high blood pressure readings, are of African descent, or are overweight.  Have your blood pressure checked: ? Every 3-5 years if you are 18-39 years of age. ? Every year if you are 40 years old or older. Diabetes Have regular diabetes screenings. This checks your fasting blood sugar level. Have the screening done:  Once every three years after age 40 if you are at a normal weight and have a low risk for diabetes.  More often and at a younger age if you are overweight or have a high risk for diabetes. What should I know about preventing infection? Hepatitis B If you have a higher risk for hepatitis B, you should be screened for this virus. Talk with your health care provider to find out if you are at risk for hepatitis B infection. Hepatitis C Testing is recommended for:  Everyone born from 1945 through 1965.  Anyone with known risk factors for hepatitis C. Sexually transmitted infections (STIs)  Get screened for STIs, including gonorrhea and chlamydia, if: ? You are sexually active and are younger than 42 years of age. ? You are older than 42 years of age and your health care provider tells you that you are at risk for this type of infection. ? Your sexual activity has changed since you were last screened, and you are at increased risk for chlamydia or gonorrhea. Ask your health care provider if   you are at risk.  Ask your health care provider about whether you are at high risk for HIV. Your health care provider may recommend a prescription medicine to help prevent HIV infection. If you choose to take medicine to prevent HIV, you should first get tested for HIV. You should then be tested every 3 months for as long as you are taking the medicine. Pregnancy  If you are about to stop having your period (premenopausal) and  you may become pregnant, seek counseling before you get pregnant.  Take 400 to 800 micrograms (mcg) of folic acid every day if you become pregnant.  Ask for birth control (contraception) if you want to prevent pregnancy. Osteoporosis and menopause Osteoporosis is a disease in which the bones lose minerals and strength with aging. This can result in bone fractures. If you are 54 years old or older, or if you are at risk for osteoporosis and fractures, ask your health care provider if you should:  Be screened for bone loss.  Take a calcium or vitamin D supplement to lower your risk of fractures.  Be given hormone replacement therapy (HRT) to treat symptoms of menopause. Follow these instructions at home: Lifestyle  Do not use any products that contain nicotine or tobacco, such as cigarettes, e-cigarettes, and chewing tobacco. If you need help quitting, ask your health care provider.  Do not use street drugs.  Do not share needles.  Ask your health care provider for help if you need support or information about quitting drugs. Alcohol use  Do not drink alcohol if: ? Your health care provider tells you not to drink. ? You are pregnant, may be pregnant, or are planning to become pregnant.  If you drink alcohol: ? Limit how much you use to 0-1 drink a day. ? Limit intake if you are breastfeeding.  Be aware of how much alcohol is in your drink. In the [place] one drink equals one 12 oz bottle of beer (355 mL), one 5 oz glass of wine (148 mL), or one 1 oz glass of hard liquor (44 mL). General instructions  Schedule regular health, dental, and eye exams.  Stay current with your vaccines.  Tell your health care provider if: ? You often feel depressed. ? You have ever been abused or do not feel safe at home. Summary  Adopting a healthy lifestyle and getting preventive care are important in promoting health and wellness.  Follow your health care provider's instructions about healthy  diet, exercising, and getting tested or screened for diseases.  Follow your health care provider's instructions on monitoring your cholesterol and blood pressure. This information is not intended to replace advice given to you by your health care provider. Make sure you discuss any questions you have with your health care provider. Document Revised: 12/17/2017 Document Reviewed: 12/17/2017 Elsevier Patient Education  2020 [place].   Kidney Stones Kidney stones are rock-like masses that form inside of the kidneys. Kidneys are organs that make pee (urine). A kidney stone may move into other parts of the urinary tract, including:  The tubes that connect the kidneys to the bladder (ureters).  The bladder.  The tube that carries urine out of the body (urethra). Kidney stones can cause very bad pain and can block the flow of pee. The stone usually leaves your body (passes) through your pee. You may need to have a doctor take out the stone. What are the causes? Kidney stones may be caused by:  A condition in  which certain glands make too much parathyroid hormone (primary hyperparathyroidism).  A buildup of a type of crystals in the bladder made of a chemical called uric acid. The body makes uric acid when you eat certain foods.  Narrowing (stricture) of one or both of the ureters.  A kidney blockage that you were born with.  Past surgery on the kidney or the ureters, such as gastric bypass surgery. What increases the risk? You are more likely to develop this condition if:  You have had a kidney stone in the past.  You have a family history of kidney stones.  You do not drink enough water.  You eat a diet that is high in protein, salt (sodium), or sugar.  You are overweight or very overweight (obese). What are the signs or symptoms? Symptoms of a kidney stone may include:  Pain in the side of the belly, right below the ribs (flank pain). Pain usually spreads (radiates) to  the groin.  Needing to pee often or right away (urgently).  Pain when going pee (urinating).  Blood in your pee (hematuria).  Feeling like you may vomit (nauseous).  Vomiting.  Fever and chills. How is this treated? Treatment depends on the size, location, and makeup of the kidney stones. The stones will often pass out of the body through peeing. You may need to:  Drink more fluid to help pass the stone. In some cases, you may be given fluids through an IV tube put into one of your veins at the hospital.  Take medicine for pain.  Make changes in your diet to help keep kidney stones from coming back. Sometimes, medical procedures are needed to remove a kidney stone. This may involve:  A procedure to break up kidney stones using a beam of light (laser) or shock waves.  Surgery to remove the kidney stones. Follow these instructions at home: Medicines  Take over-the-counter and prescription medicines only as told by your doctor.  Ask your doctor if the medicine prescribed to you requires you to avoid driving or using heavy machinery. Eating and drinking  Drink enough fluid to keep your pee pale yellow. You may be told to drink at least 8-10 glasses of water each day. This will help you pass the stone.  If told by your doctor, change your diet. This may include: ? Limiting how much salt you eat. ? Eating more fruits and vegetables. ? Limiting how much meat, poultry, fish, and eggs you eat.  Follow instructions from your doctor about eating or drinking restrictions. General instructions  Collect pee samples as told by your doctor. You may need to collect a pee sample: ? 24 hours after a stone comes out. ? 8-12 weeks after a stone comes out, and every 6-12 months after that.  Strain your pee every time you pee (urinate), for as long as told. Use the strainer that your doctor recommends.  Do not throw out the stone. Keep it so that it can be tested by your doctor.  Keep  all follow-up visits as told by your doctor. This is important. You may need follow-up tests. How is this prevented? To prevent another kidney stone:  Drink enough fluid to keep your pee pale yellow. This is the best way to prevent kidney stones.  Eat healthy foods.  Avoid certain foods as told by your doctor. You may be told to eat less protein.  Stay at a healthy weight. Where to find more information  [place] [facility] [employer]):  www.kidney.[facility]): www.urologyhealth.org Contact a doctor if:  You have pain that gets worse or does not get better with medicine. Get help right away if:  You have a fever or chills.  You get very bad pain.  You get new pain in your belly (abdomen).  You pass out (faint).  You cannot pee. Summary  Kidney stones are rock-like masses that form inside of the kidneys.  Kidney stones can cause very bad pain and can block the flow of pee.  The stones will often pass out of the body through peeing.  Drink enough fluid to keep your pee pale yellow. This information is not intended to replace advice given to you by your health care provider. Make sure you discuss any questions you have with your health care provider. Document Revised: 05/12/2018 Document Reviewed: 05/12/2018 Elsevier Patient Education  [place].

## 2019-01-28 LAB — COMPLETE METABOLIC PANEL WITH GFR
AG Ratio: 1.8 (calc) (ref 1.0–2.5)
ALT: 13 U/L (ref 6–29)
AST: 15 U/L (ref 10–30)
Albumin: 4.2 g/dL (ref 3.6–5.1)
Alkaline phosphatase (APISO): 43 U/L (ref 31–125)
BUN: 13 mg/dL (ref 7–25)
CO2: 29 mmol/L (ref 20–32)
Calcium: 9.3 mg/dL (ref 8.6–10.2)
Chloride: 103 mmol/L (ref 98–110)
Creat: 0.82 mg/dL (ref 0.50–1.10)
GFR, Est African American: 103 mL/min/{1.73_m2} (ref >=60)
GFR, Est Non African American: 89 mL/min/{1.73_m2} (ref >=60)
Globulin: 2.4 g/dL (calc) (ref 1.9–3.7)
Glucose, Bld: 87 mg/dL (ref 65–99)
Potassium: 4.2 mmol/L (ref 3.5–5.3)
Sodium: 139 mmol/L (ref 135–146)
Total Bilirubin: 0.4 mg/dL (ref 0.2–1.2)
Total Protein: 6.6 g/dL (ref 6.1–8.1)

## 2019-01-28 LAB — LIPID PANEL W/REFLEX DIRECT LDL
Cholesterol: 141 mg/dL (ref <200)
HDL: 63 mg/dL (ref >=50)
LDL Cholesterol (Calc): 59 mg/dL (calc)
Non-HDL Cholesterol (Calc): 78 mg/dL (calc) (ref <130)
Total CHOL/HDL Ratio: 2.2 (calc) (ref <5.0)
Triglycerides: 106 mg/dL (ref <150)

## 2019-01-28 LAB — HEMOGLOBIN A1C
Hgb A1c MFr Bld: 5.5 % of total Hgb (ref <5.7)
Mean Plasma Glucose: 111 (calc)
eAG (mmol/L): 6.2 (calc)

## 2019-01-29 ENCOUNTER — Encounter: Payer: Self-pay | Admitting: Physician Assistant

## 2019-01-29 NOTE — Progress Notes (Signed)
Marcia Lara,   Labs look great. A1C is 5.5 normal range but 5.7 or greater is pre-diabetes. Make sure you are watching sugars and carbs. Will fill out work form and send it in!  -Luvenia Starch

## 2019-02-12 LAB — HM MAMMOGRAPHY

## 2019-03-04 LAB — HM PAP SMEAR

## 2019-03-18 ENCOUNTER — Encounter: Payer: Self-pay | Admitting: Physician Assistant

## 2019-06-08 ENCOUNTER — Ambulatory Visit (INDEPENDENT_AMBULATORY_CARE_PROVIDER_SITE_OTHER): Payer: 59 | Admitting: Physician Assistant

## 2019-06-08 ENCOUNTER — Encounter: Payer: Self-pay | Admitting: Physician Assistant

## 2019-06-08 VITALS — BP 113/60 | HR 98 | Ht 65.0 in | Wt 114.0 lb

## 2019-06-08 DIAGNOSIS — R310 Gross hematuria: Secondary | ICD-10-CM | POA: Insufficient documentation

## 2019-06-08 DIAGNOSIS — R300 Dysuria: Secondary | ICD-10-CM

## 2019-06-08 DIAGNOSIS — R109 Unspecified abdominal pain: Secondary | ICD-10-CM | POA: Diagnosis not present

## 2019-06-08 DIAGNOSIS — R1032 Left lower quadrant pain: Secondary | ICD-10-CM

## 2019-06-08 LAB — POCT URINALYSIS DIP (CLINITEK)
Bilirubin, UA: NEGATIVE
Glucose, UA: NEGATIVE mg/dL
Ketones, POC UA: NEGATIVE mg/dL
Leukocytes, UA: NEGATIVE
Nitrite, UA: NEGATIVE
POC PROTEIN,UA: NEGATIVE
Spec Grav, UA: <=1.005 — AB (ref 1.010–1.025)
Urobilinogen, UA: 0.2 E.U./dL
pH, UA: 6.5 (ref 5.0–8.0)

## 2019-06-08 MED ORDER — PHENAZOPYRIDINE HCL 200 MG PO TABS: 200.0000 mg | ORAL_TABLET | Freq: Three times a day (TID) | ORAL | 0 refills | Status: AC

## 2019-06-08 MED ORDER — TAMSULOSIN HCL 0.4 MG PO CAPS: 0.4000 mg | ORAL_CAPSULE | Freq: Every day | ORAL | 0 refills | Status: DC

## 2019-06-08 NOTE — Progress Notes (Signed)
Subjective:    Patient ID: Marcia Lara, female    DOB: 16-Apr-1975, 44 y.o.   MRN: [id number]  HPI  Pt is a 44 yo female who presents to the clinic to discuss episodes of left flank pain, hematuria, and dysuria. She has had 4 episodes since March 2020. The last 2 have been in same month. She starts with tremendous bladder pressure and dysuria and then she get left flank pain and nausea.she then passes huge blood clots.  No fever or chills. She is nauseated but no vomiting. Last around 12-24 hours and then resolves. She usually takes ibuprofen for pain. Not associated with period or diet to her knowledge.  Last episode was 2 days ago and ended yesterday afternoon.she stopped OTC calcium and drinking tea.  .. Active Ambulatory Problems    Diagnosis Date Noted  . Eye muscle twitches 06/04/2016  . Lump on neck 06/04/2016  . Left flank pain 06/08/2019  . Gross hematuria 06/08/2019   Resolved Ambulatory Problems    Diagnosis Date Noted  . CHEST PAIN 09/13/2009   No Additional Past Medical History     Review of Systems See HPI.     Objective:   Physical Exam Vitals reviewed.  Constitutional:      Appearance: Normal appearance.  HENT:     Head: Normocephalic.  Cardiovascular:     Rate and Rhythm: Normal rate and regular rhythm.     Pulses: Normal pulses.     Heart sounds: Normal heart sounds.  Abdominal:     General: There is distension.     Palpations: Abdomen is soft.     Tenderness: There is no abdominal tenderness. There is left CVA tenderness. There is no right CVA tenderness, guarding or rebound.     Comments: Dull tenderness over left CVA.   Neurological:     General: No focal deficit present.     Mental Status: She is alert and oriented to person, place, and time.  Psychiatric:        Mood and Affect: Mood normal.           Assessment & Plan:  Marland KitchenMarland KitchenCleta was seen today for nephrolithiasis.  Diagnoses and all orders for this visit:  Gross hematuria -      POCT URINALYSIS DIP (CLINITEK) -     Urine Culture -     phenazopyridine (PYRIDIUM) 200 MG tablet; Take 1 tablet (200 mg total) by mouth 3 (three) times daily for 2 days. -     CT RENAL STONE STUDY  Left flank pain -     tamsulosin (FLOMAX) 0.4 MG CAPS capsule; Take 1 capsule (0.4 mg total) by mouth daily. -     phenazopyridine (PYRIDIUM) 200 MG tablet; Take 1 tablet (200 mg total) by mouth 3 (three) times daily for 2 days. -     CT RENAL STONE STUDY  Left lower quadrant abdominal pain -     tamsulosin (FLOMAX) 0.4 MG CAPS capsule; Take 1 capsule (0.4 mg total) by mouth daily. -     phenazopyridine (PYRIDIUM) 200 MG tablet; Take 1 tablet (200 mg total) by mouth 3 (three) times daily for 2 days. -     CT RENAL STONE STUDY  Dysuria -     phenazopyridine (PYRIDIUM) 200 MG tablet; Take 1 tablet (200 mg total) by mouth 3 (three) times daily for 2 days.   Results for orders placed or performed in visit on 06/08/19  POCT URINALYSIS DIP (CLINITEK)  Result Value Ref  Range   Color, UA yellow yellow   Clarity, UA clear clear   Glucose, UA negative negative mg/dL   Bilirubin, UA negative negative   Ketones, POC UA negative negative mg/dL   Spec Grav, UA <=1.005 (A) 1.010 - 1.025   Blood, UA trace-lysed (A) negative   pH, UA 6.5 5.0 - 8.0   POC PROTEIN,UA negative negative, trace   Urobilinogen, UA 0.2 0.2 or 1.0 E.U./dL   Nitrite, UA Negative Negative   Leukocytes, UA Negative Negative   Trace blood found in urine. No signs of infection.  Sent UA for culture.  Sounds like she is passing kidney stones.  Will get CT since passing of stone been recent and still has trace blood and left flank pain. Discussed prevention.  Pt stopped calcium OTC and caffeine.  Given flomax and pyridium if has another exacerbation.  Ibuprofen 800mg  as needed as well.  Will follow up after CT.

## 2019-06-08 NOTE — Patient Instructions (Signed)
Dietary Guidelines to Help Prevent Kidney Stones Kidney stones are deposits of minerals and salts that form inside your kidneys. Your risk of developing kidney stones may be greater depending on your diet, your lifestyle, the medicines you take, and whether you have certain medical conditions. Most people can reduce their chances of developing kidney stones by following the instructions below. Depending on your overall health and the type of kidney stones you tend to develop, your dietitian may give you more specific instructions. What are tips for following this plan? Reading food labels  Choose foods with "no salt added" or "low-salt" labels. Limit your sodium intake to less than 1500 mg per day.  Choose foods with calcium for each meal and snack. Try to eat about 300 mg of calcium at each meal. Foods that contain 200-500 mg of calcium per serving include: ? 8 oz (237 ml) of milk, fortified nondairy milk, and fortified fruit juice. ? 8 oz (237 ml) of kefir, yogurt, and soy yogurt. ? 4 oz (118 ml) of tofu. ? 1 oz of cheese. ? 1 cup (300 g) of dried figs. ? 1 cup (91 g) of cooked broccoli. ? 1-3 oz can of sardines or mackerel.  Most people need 1000 to 1500 mg of calcium each day. Talk to your dietitian about how much calcium is recommended for you. Shopping  Buy plenty of fresh fruits and vegetables. Most people do not need to avoid fruits and vegetables, even if they contain nutrients that may contribute to kidney stones.  When shopping for convenience foods, choose: ? Whole pieces of fruit. ? Premade salads with dressing on the side. ? Low-fat fruit and yogurt smoothies.  Avoid buying frozen meals or prepared deli foods.  Look for foods with live cultures, such as yogurt and kefir. Cooking  Do not add salt to food when cooking. Place a salt shaker on the table and allow each person to add his or her own salt to taste.  Use vegetable protein, such as beans, textured vegetable  protein (TVP), or tofu instead of meat in pasta, casseroles, and soups. Meal planning   Eat less salt, if told by your dietitian. To do this: ? Avoid eating processed or premade food. ? Avoid eating fast food.  Eat less animal protein, including cheese, meat, poultry, or fish, if told by your dietitian. To do this: ? Limit the number of times you have meat, poultry, fish, or cheese each week. Eat a diet free of meat at least 2 days a week. ? Eat only one serving each day of meat, poultry, fish, or seafood. ? When you prepare animal protein, cut pieces into small portion sizes. For most meat and fish, one serving is about the size of one deck of cards.  Eat at least 5 servings of fresh fruits and vegetables each day. To do this: ? Keep fruits and vegetables on hand for snacks. ? Eat 1 piece of fruit or a handful of berries with breakfast. ? Have a salad and fruit at lunch. ? Have two kinds of vegetables at dinner.  Limit foods that are high in a substance called oxalate. These include: ? Spinach. ? Rhubarb. ? Beets. ? Potato chips and french fries. ? Nuts.  If you regularly take a diuretic medicine, make sure to eat at least 1-2 fruits or vegetables high in potassium each day. These include: ? Avocado. ? Banana. ? Orange, prune, carrot, or tomato juice. ? Baked potato. ? Cabbage. ? Beans and split   peas. General instructions   Drink enough fluid to keep your urine clear or pale yellow. This is the most important thing you can do.  Talk to your health care provider and dietitian about taking daily supplements. Depending on your health and the cause of your kidney stones, you may be advised: ? Not to take supplements with vitamin C. ? To take a calcium supplement. ? To take a daily probiotic supplement. ? To take other supplements such as magnesium, fish oil, or vitamin B6.  Take all medicines and supplements as told by your health care provider.  Limit alcohol intake to no  more than 1 drink a day for nonpregnant women and 2 drinks a day for men. One drink equals 12 oz of beer, 5 oz of wine, or 1 oz of hard liquor.  Lose weight if told by your health care provider. Work with your dietitian to find strategies and an eating plan that works best for you. What foods are not recommended? Limit your intake of the following foods, or as told by your dietitian. Talk to your dietitian about specific foods you should avoid based on the type of kidney stones and your overall health. Grains Breads. Bagels. Rolls. Baked goods. Salted crackers. Cereal. Pasta. Vegetables Spinach. Rhubarb. Beets. Canned vegetables. Pickles. Olives. Meats and other protein foods Nuts. Nut butters. Large portions of meat, poultry, or fish. Salted or cured meats. Deli meats. Hot dogs. Sausages. Dairy Cheese. Beverages Regular soft drinks. Regular vegetable juice. Seasonings and other foods Seasoning blends with salt. Salad dressings. Canned soups. Soy sauce. Ketchup. Barbecue sauce. Canned pasta sauce. Casseroles. Pizza. Lasagna. Frozen meals. Potato chips. French fries. Summary  You can reduce your risk of kidney stones by making changes to your diet.  The most important thing you can do is drink enough fluid. You should drink enough fluid to keep your urine clear or pale yellow.  Ask your health care provider or dietitian how much protein from animal sources you should eat each day, and also how much salt and calcium you should have each day. This information is not intended to replace advice given to you by your health care provider. Make sure you discuss any questions you have with your health care provider. Document Revised: 04/15/2018 Document Reviewed: 12/05/2015 Elsevier Patient Education  2020 [employer] Inc[employer] Kidney Stones  Kidney stones are solid, rock-like deposits that form inside of the kidneys. The kidneys are a pair of organs that make urine. A kidney stone may form in a kidney  and move into other parts of the urinary tract, including the tubes that connect the kidneys to the bladder (ureters), the bladder, and the tube that carries urine out of the body (urethra). As the stone moves through these areas, it can cause intense pain and block the flow of urine. Kidney stones are created when high levels of certain minerals are found in the urine. The stones are usually passed out of the body through urination, but in some cases, medical treatment may be needed to remove them. What are the causes? Kidney stones may be caused by:  A condition in which certain glands produce too much parathyroid hormone (primary hyperparathyroidism), which causes too much calcium buildup in the blood.  A buildup of uric acid crystals in the bladder (hyperuricosuria). Uric acid is a chemical that the body produces when you eat certain foods. It usually exits the body in the urine.  Narrowing (stricture) of one or both of the ureters.  A   kidney blockage that is present at birth (congenital obstruction).  Past surgery on the kidney or the ureters, such as gastric bypass surgery. What increases the risk? The following factors may make you more likely to develop this condition:  Having had a kidney stone in the past.  Having a family history of kidney stones.  Not drinking enough water.  Eating a diet that is high in protein, salt (sodium), or sugar.  Being overweight or obese. What are the signs or symptoms? Symptoms of a kidney stone may include:  Pain in the side of the abdomen, right below the ribs (flank pain). Pain usually spreads (radiates) to the groin.  Needing to urinate frequently or urgently.  Painful urination.  Blood in the urine (hematuria).  Nausea.  Vomiting.  Fever and chills. How is this diagnosed? This condition may be diagnosed based on:  Your symptoms and medical history.  A physical exam.  Blood tests.  Urine tests. These may be done before and  after the stone passes out of your body through urination.  Imaging tests, such as a CT scan, abdominal X-ray, or ultrasound.  A procedure to examine the inside of the bladder (cystoscopy). How is this treated? Treatment for kidney stones depends on the size, location, and makeup of the stones. Kidney stones will often pass out of the body through urination. You may need to:  Increase your fluid intake to help pass the stone. In some cases, you may be given fluids through an IV and may need to be monitored at the hospital.  Take medicine for pain.  Make changes in your diet to help prevent kidney stones from coming back. Sometimes, medical procedures are needed to remove a kidney stone. This may involve:  A procedure to break up kidney stones using: ? A focused beam of light (laser therapy). ? Shock waves (extracorporeal shock wave lithotripsy).  Surgery to remove kidney stones. This may be needed if you have severe pain or have stones that block your urinary tract. Follow these instructions at home: Medicines  Take over-the-counter and prescription medicines only as told by your health care provider.  Ask your health care provider if the medicine prescribed to you requires you to avoid driving or using heavy machinery. Eating and drinking  Drink enough fluid to keep your urine pale yellow. You may be instructed to drink at least 8-10 glasses of water each day. This will help you pass the kidney stone.  If directed, change your diet. This may include: ? Limiting how much sodium you eat. ? Eating more fruits and vegetables. ? Limiting how much animal protein--such as red meat, poultry, fish, and eggs--you eat.  Follow instructions from your health care provider about eating or drinking restrictions. General instructions  Collect urine samples as told by your health care provider. You may need to collect a urine sample: ? 24 hours after you pass the stone. ? 8-12 weeks after  passing the kidney stone, and every 6-12 months after that.  Strain your urine every time you urinate, for as long as directed. Use the strainer that your health care provider recommends.  Do not throw out the kidney stone after passing it. Keep the stone so it can be tested by your health care provider. Testing the makeup of your kidney stone may help prevent you from getting kidney stones in the future.  Keep all follow-up visits as told by your health care provider. This is important. You may need follow-up X-rays or   ultrasounds to make sure that your stone has passed. How is this prevented? To prevent another kidney stone:  Drink enough fluid to keep your urine pale yellow. This is the best way to prevent kidney stones.  Eat a healthy diet and follow recommendations from your health care provider about foods to avoid. You may be instructed to eat a low-protein diet. Recommendations vary depending on the type of kidney stone that you have.  Maintain a healthy weight. Where to find more information  National Kidney Foundation (NKF): www.kidney.org  [facility] ([facility]): www.urologyhealth.org Contact a health care provider if:  You have pain that gets worse or does not get better with medicine. Get help right away if:  You have a fever or chills.  You develop severe pain.  You develop new abdominal pain.  You faint.  You are unable to urinate. Summary  Kidney stones are solid, rock-like deposits that form inside of the kidneys.  Kidney stones can cause nausea, vomiting, blood in the urine, abdominal pain, and the urge to urinate frequently.  Treatment for kidney stones depends on the size, location, and makeup of the stones. Kidney stones will often pass out of the body through urination.  Kidney stones can be prevented by drinking enough fluids, eating a healthy diet, and maintaining a healthy weight. This information is not intended to replace advice given to  you by your health care provider. Make sure you discuss any questions you have with your health care provider. Document Revised: 05/12/2018 Document Reviewed: 05/12/2018 Elsevier Patient Education  2020 Elsevier Inc.  

## 2019-06-09 ENCOUNTER — Encounter: Payer: Self-pay | Admitting: Physician Assistant

## 2019-06-09 DIAGNOSIS — R310 Gross hematuria: Secondary | ICD-10-CM

## 2019-06-09 DIAGNOSIS — R109 Unspecified abdominal pain: Secondary | ICD-10-CM

## 2019-06-09 LAB — URINE CULTURE
MICRO NUMBER:: 10538339
Result:: NO GROWTH
SPECIMEN QUALITY:: ADEQUATE

## 2019-06-09 NOTE — Telephone Encounter (Signed)
Can you check on this?

## 2019-06-10 NOTE — Telephone Encounter (Signed)
Done

## 2019-06-11 NOTE — Addendum Note (Signed)
Addended by: Donella Stade on: 06/11/2019 12:28 PM   Modules accepted: Orders

## 2019-06-11 NOTE — Progress Notes (Signed)
[place],   No growth on urine culture. No infection present as suspected.

## 2019-06-14 ENCOUNTER — Ambulatory Visit (INDEPENDENT_AMBULATORY_CARE_PROVIDER_SITE_OTHER): Payer: 59

## 2019-06-14 ENCOUNTER — Other Ambulatory Visit: Payer: Self-pay

## 2019-06-14 DIAGNOSIS — R109 Unspecified abdominal pain: Secondary | ICD-10-CM | POA: Diagnosis not present

## 2019-06-14 DIAGNOSIS — R1032 Left lower quadrant pain: Secondary | ICD-10-CM

## 2019-06-14 DIAGNOSIS — R310 Gross hematuria: Secondary | ICD-10-CM

## 2019-06-14 NOTE — Progress Notes (Signed)
Marcia Lara,   GREAT news but does not answer our question of where these episodes are coming from. No obstructing lesions. Urinary bladder looks great. No increase in size of kidney. No waiting kidney stones. No masses or lesions. Your abdominal and pelvis looks great.   Would you at least want to speak with urologist and just see what he has to say?

## 2020-02-16 ENCOUNTER — Other Ambulatory Visit: Payer: Self-pay

## 2020-02-16 ENCOUNTER — Ambulatory Visit (INDEPENDENT_AMBULATORY_CARE_PROVIDER_SITE_OTHER): Payer: 59 | Admitting: Physician Assistant

## 2020-02-16 ENCOUNTER — Encounter: Payer: Self-pay | Admitting: Physician Assistant

## 2020-02-16 VITALS — BP 113/54 | HR 94 | Ht 65.0 in | Wt 113.0 lb

## 2020-02-16 DIAGNOSIS — Z1159 Encounter for screening for other viral diseases: Secondary | ICD-10-CM | POA: Diagnosis not present

## 2020-02-16 DIAGNOSIS — Z13220 Encounter for screening for lipoid disorders: Secondary | ICD-10-CM

## 2020-02-16 DIAGNOSIS — Z131 Encounter for screening for diabetes mellitus: Secondary | ICD-10-CM

## 2020-02-16 DIAGNOSIS — Z0000 Encounter for general adult medical examination without abnormal findings: Secondary | ICD-10-CM

## 2020-02-16 DIAGNOSIS — Z114 Encounter for screening for human immunodeficiency virus [HIV]: Secondary | ICD-10-CM | POA: Diagnosis not present

## 2020-02-16 LAB — CBC WITH DIFFERENTIAL/PLATELET
Lymphs Abs: 1557 cells/uL (ref 850–3900)
MCH: 30.1 pg (ref 27.0–33.0)
RBC: 4.42 10*6/uL (ref 3.80–5.10)
Total Lymphocyte: 27.8 %
WBC: 5.6 10*3/uL (ref 3.8–10.8)

## 2020-02-16 NOTE — Patient Instructions (Signed)
Health Maintenance, Female Adopting a healthy lifestyle and getting preventive care are important in promoting health and wellness. Ask your health care provider about:  The right schedule for you to have regular tests and exams.  Things you can do on your own to prevent diseases and keep yourself healthy. What should I know about diet, weight, and exercise? Eat a healthy diet  Eat a diet that includes plenty of vegetables, fruits, low-fat dairy products, and lean protein.  Do not eat a lot of foods that are high in solid fats, added sugars, or sodium.   Maintain a healthy weight Body mass index (BMI) is used to identify weight problems. It estimates body fat based on height and weight. Your health care provider can help determine your BMI and help you achieve or maintain a healthy weight. Get regular exercise Get regular exercise. This is one of the most important things you can do for your health. Most adults should:  Exercise for at least 150 minutes each week. The exercise should increase your heart rate and make you sweat (moderate-intensity exercise).  Do strengthening exercises at least twice a week. This is in addition to the moderate-intensity exercise.  Spend less time sitting. Even light physical activity can be beneficial. Watch cholesterol and blood lipids Have your blood tested for lipids and cholesterol at 43 years of age, then have this test every 5 years. Have your cholesterol levels checked more often if:  Your lipid or cholesterol levels are high.  You are older than 43 years of age.  You are at high risk for heart disease. What should I know about cancer screening? Depending on your health history and family history, you may need to have cancer screening at various ages. This may include screening for:  Breast cancer.  Cervical cancer.  Colorectal cancer.  Skin cancer.  Lung cancer. What should I know about heart disease, diabetes, and high blood  pressure? Blood pressure and heart disease  High blood pressure causes heart disease and increases the risk of stroke. This is more likely to develop in people who have high blood pressure readings, are of African descent, or are overweight.  Have your blood pressure checked: ? Every 3-5 years if you are 18-39 years of age. ? Every year if you are 40 years old or older. Diabetes Have regular diabetes screenings. This checks your fasting blood sugar level. Have the screening done:  Once every three years after age 40 if you are at a normal weight and have a low risk for diabetes.  More often and at a younger age if you are overweight or have a high risk for diabetes. What should I know about preventing infection? Hepatitis B If you have a higher risk for hepatitis B, you should be screened for this virus. Talk with your health care provider to find out if you are at risk for hepatitis B infection. Hepatitis C Testing is recommended for:  Everyone born from 1945 through 1965.  Anyone with known risk factors for hepatitis C. Sexually transmitted infections (STIs)  Get screened for STIs, including gonorrhea and chlamydia, if: ? You are sexually active and are younger than 43 years of age. ? You are older than 43 years of age and your health care provider tells you that you are at risk for this type of infection. ? Your sexual activity has changed since you were last screened, and you are at increased risk for chlamydia or gonorrhea. Ask your health care provider   if you are at risk.  Ask your health care provider about whether you are at high risk for HIV. Your health care provider may recommend a prescription medicine to help prevent HIV infection. If you choose to take medicine to prevent HIV, you should first get tested for HIV. You should then be tested every 3 months for as long as you are taking the medicine. Pregnancy  If you are about to stop having your period (premenopausal) and  you may become pregnant, seek counseling before you get pregnant.  Take 400 to 800 micrograms (mcg) of folic acid every day if you become pregnant.  Ask for birth control (contraception) if you want to prevent pregnancy. Osteoporosis and menopause Osteoporosis is a disease in which the bones lose minerals and strength with aging. This can result in bone fractures. If you are 65 years old or older, or if you are at risk for osteoporosis and fractures, ask your health care provider if you should:  Be screened for bone loss.  Take a calcium or vitamin D supplement to lower your risk of fractures.  Be given hormone replacement therapy (HRT) to treat symptoms of menopause. Follow these instructions at home: Lifestyle  Do not use any products that contain nicotine or tobacco, such as cigarettes, e-cigarettes, and chewing tobacco. If you need help quitting, ask your health care provider.  Do not use street drugs.  Do not share needles.  Ask your health care provider for help if you need support or information about quitting drugs. Alcohol use  Do not drink alcohol if: ? Your health care provider tells you not to drink. ? You are pregnant, may be pregnant, or are planning to become pregnant.  If you drink alcohol: ? Limit how much you use to 0-1 drink a day. ? Limit intake if you are breastfeeding.  Be aware of how much alcohol is in your drink. In the [place] one drink equals one 12 oz bottle of beer (355 mL), one 5 oz glass of wine (148 mL), or one 1 oz glass of hard liquor (44 mL). General instructions  Schedule regular health, dental, and eye exams.  Stay current with your vaccines.  Tell your health care provider if: ? You often feel depressed. ? You have ever been abused or do not feel safe at home. Summary  Adopting a healthy lifestyle and getting preventive care are important in promoting health and wellness.  Follow your health care provider's instructions about healthy  diet, exercising, and getting tested or screened for diseases.  Follow your health care provider's instructions on monitoring your cholesterol and blood pressure. This information is not intended to replace advice given to you by your health care provider. Make sure you discuss any questions you have with your health care provider. Document Revised: 12/17/2017 Document Reviewed: 12/17/2017 Elsevier Patient Education  2021 Elsevier Inc.  

## 2020-02-16 NOTE — Progress Notes (Signed)
Subjective:     Marcia Lara is a 45 y.o. female and is here for a comprehensive physical exam. The patient reports no problems.  Social History   Socioeconomic History  . Marital status: Married    Spouse name: Not on file  . Number of children: 3  . Years of education: Not on file  . Highest education level: Not on file  Occupational History  . Not on file  Tobacco Use  . Smoking status: Never Smoker  . Smokeless tobacco: Never Used  Substance and Sexual Activity  . Alcohol use: Yes  . Drug use: No  . Sexual activity: Yes    Partners: Male  Other Topics Concern  . Not on file  Social History Narrative  . Not on file   Social Determinants of Health   Financial Resource Strain: Not on file  Food Insecurity: Not on file  Transportation Needs: Not on file  Physical Activity: Not on file  Stress: Not on file  Social Connections: Not on file  Intimate Partner Violence: Not on file   Health Maintenance  Topic Date Due  . Hepatitis C Screening  Never done  . HIV Screening  Never done  . INFLUENZA VACCINE  08/08/2019  . PAP SMEAR-Modifier  02/28/2023  . TETANUS/TDAP  10/20/2023    The following portions of the patient's history were reviewed and updated as appropriate: allergies, current medications, past family history, past medical history, past social history, past surgical history and problem list.  Review of Systems A comprehensive review of systems was negative.   Objective:    BP (!) 113/54   Pulse 94   Ht 5\' 5"  (1.651 m)   Wt 113 lb (51.3 kg)   SpO2 100%   BMI 18.80 kg/m  General appearance: alert, cooperative and appears stated age Head: Normocephalic, without obvious abnormality, atraumatic Eyes: conjunctivae/corneas clear. PERRL, EOM's intact. Fundi benign. Ears: normal TM's and external ear canals both ears Nose: Nares normal. Septum midline. Mucosa normal. No drainage or sinus tenderness. Throat: lips, mucosa, and tongue normal; teeth and  gums normal Neck: no adenopathy, no carotid bruit, no JVD, supple, symmetrical, trachea midline and thyroid not enlarged, symmetric, no tenderness/mass/nodules Back: symmetric, no curvature. ROM normal. No CVA tenderness. Lungs: clear to auscultation bilaterally Heart: regular rate and rhythm, S1, S2 normal, no murmur, click, rub or gallop Abdomen: soft, non-tender; bowel sounds normal; no masses,  no organomegaly Extremities: extremities normal, atraumatic, no cyanosis or edema Pulses: 2+ and symmetric Skin: Skin color, texture, turgor normal. No rashes or lesions Lymph nodes: Cervical, supraclavicular, and axillary nodes normal. Neurologic: Alert and oriented X 3, normal strength and tone. Normal symmetric reflexes. Normal coordination and gait    Assessment:    Healthy female exam.      Plan:     Marcia Lara KitchenMarland KitchenAnaly was seen today for annual exam.  Diagnoses and all orders for this visit:  Routine physical examination -     Hepatitis C Antibody -     HIV antibody (with reflex) -     COMPLETE METABOLIC PANEL WITH GFR -     Lipid Panel w/reflex Direct LDL -     TSH -     CBC with Differential/Platelet -     Hemoglobin A1c  Encounter for hepatitis C screening test for low risk patient -     Hepatitis C Antibody  Screening for HIV (human immunodeficiency virus) -     HIV antibody (with reflex)  Screening for diabetes  mellitus -     COMPLETE METABOLIC PANEL WITH GFR  Screening for lipid disorders -     Lipid Panel w/reflex Direct LDL    Discussed 150 minutes of exercise a week.  Encouraged vitamin D 1000 units and Calcium 1300mg  or 4 servings of dairy a day.  Fasting labs ordered.  Mammogram due for march.  UTD pap. See's GYN yearly.  Declined flu/covid vaccine.   See After Visit Summary for Counseling Recommendations

## 2020-02-17 LAB — CBC WITH DIFFERENTIAL/PLATELET
Absolute Monocytes: 431 cells/uL (ref 200–950)
Basophils Absolute: 22 cells/uL (ref 0–200)
Basophils Relative: 0.4 %
Eosinophils Absolute: 62 cells/uL (ref 15–500)
Eosinophils Relative: 1.1 %
HCT: 40.1 % (ref 35.0–45.0)
Hemoglobin: 13.3 g/dL (ref 11.7–15.5)
MCHC: 33.2 g/dL (ref 32.0–36.0)
MCV: 90.7 fL (ref 80.0–100.0)
MPV: 10.8 fL (ref 7.5–12.5)
Monocytes Relative: 7.7 %
Neutro Abs: 3528 cells/uL (ref 1500–7800)
Neutrophils Relative %: 63 %
Platelets: 281 10*3/uL (ref 140–400)
RDW: 12.5 % (ref 11.0–15.0)

## 2020-02-17 LAB — LIPID PANEL W/REFLEX DIRECT LDL
Cholesterol: 142 mg/dL (ref <200)
HDL: 66 mg/dL (ref >=50)
LDL Cholesterol (Calc): 64 mg/dL (calc)
Non-HDL Cholesterol (Calc): 76 mg/dL (calc) (ref <130)
Total CHOL/HDL Ratio: 2.2 (calc) (ref <5.0)
Triglycerides: 43 mg/dL (ref <150)

## 2020-02-17 LAB — COMPLETE METABOLIC PANEL WITH GFR
AG Ratio: 2 (calc) (ref 1.0–2.5)
ALT: 15 U/L (ref 6–29)
AST: 19 U/L (ref 10–30)
Albumin: 4.4 g/dL (ref 3.6–5.1)
Alkaline phosphatase (APISO): 48 U/L (ref 31–125)
BUN: 13 mg/dL (ref 7–25)
CO2: 29 mmol/L (ref 20–32)
Calcium: 9.2 mg/dL (ref 8.6–10.2)
Chloride: 103 mmol/L (ref 98–110)
Creat: 0.83 mg/dL (ref 0.50–1.10)
GFR, Est African American: 101 mL/min/{1.73_m2} (ref >=60)
GFR, Est Non African American: 87 mL/min/{1.73_m2} (ref >=60)
Globulin: 2.2 g/dL (calc) (ref 1.9–3.7)
Glucose, Bld: 83 mg/dL (ref 65–99)
Potassium: 4.1 mmol/L (ref 3.5–5.3)
Sodium: 139 mmol/L (ref 135–146)
Total Bilirubin: 0.7 mg/dL (ref 0.2–1.2)
Total Protein: 6.6 g/dL (ref 6.1–8.1)

## 2020-02-17 LAB — HEMOGLOBIN A1C
Hgb A1c MFr Bld: 5.3 % of total Hgb (ref <5.7)
Mean Plasma Glucose: 105 mg/dL
eAG (mmol/L): 5.8 mmol/L

## 2020-02-17 LAB — TSH: TSH: 1.14 mIU/L

## 2020-02-17 LAB — HEPATITIS C ANTIBODY
Hepatitis C Ab: NONREACTIVE
SIGNAL TO CUT-OFF: 0.04 (ref <1.00)

## 2020-02-17 LAB — HIV ANTIBODY (ROUTINE TESTING W REFLEX): HIV 1&2 Ab, 4th Generation: NONREACTIVE

## 2020-02-17 NOTE — Progress Notes (Signed)
Marcia Lara,   Glucose, kidney, liver look great.  Cholesterol perfect.  Thyroid perfect.  No anemia.   JJ please fill in these numbers and fax physical form.

## 2021-02-19 ENCOUNTER — Other Ambulatory Visit: Payer: Self-pay

## 2021-02-19 ENCOUNTER — Encounter: Payer: Self-pay | Admitting: Physician Assistant

## 2021-02-19 ENCOUNTER — Ambulatory Visit (INDEPENDENT_AMBULATORY_CARE_PROVIDER_SITE_OTHER): Payer: 59 | Admitting: Physician Assistant

## 2021-02-19 VITALS — BP 110/55 | HR 79 | Ht 65.0 in | Wt 116.0 lb

## 2021-02-19 DIAGNOSIS — Z0000 Encounter for general adult medical examination without abnormal findings: Secondary | ICD-10-CM

## 2021-02-19 DIAGNOSIS — Z1329 Encounter for screening for other suspected endocrine disorder: Secondary | ICD-10-CM

## 2021-02-19 DIAGNOSIS — Z131 Encounter for screening for diabetes mellitus: Secondary | ICD-10-CM | POA: Diagnosis not present

## 2021-02-19 DIAGNOSIS — Z23 Encounter for immunization: Secondary | ICD-10-CM | POA: Diagnosis not present

## 2021-02-19 DIAGNOSIS — Z13220 Encounter for screening for lipoid disorders: Secondary | ICD-10-CM

## 2021-02-19 NOTE — Progress Notes (Signed)
Subjective:     Laurabeth Yip is a 46 y.o. female and is here for a comprehensive physical exam. The patient reports no problems.  Social History   Socioeconomic History   Marital status: Married    Spouse name: Not on file   Number of children: 3   Years of education: Not on file   Highest education level: Not on file  Occupational History   Not on file  Tobacco Use   Smoking status: Never   Smokeless tobacco: Never  Substance and Sexual Activity   Alcohol use: Yes   Drug use: No   Sexual activity: Yes    Partners: Male  Other Topics Concern   Not on file  Social History Narrative   Not on file   Social Determinants of Health   Financial Resource Strain: Not on file  Food Insecurity: Not on file  Transportation Needs: Not on file  Physical Activity: Not on file  Stress: Not on file  Social Connections: Not on file  Intimate Partner Violence: Not on file   Health Maintenance  Topic Date Due   MAMMOGRAM  02/12/2020   INFLUENZA VACCINE  08/07/2020   PAP SMEAR-Modifier  02/28/2023   TETANUS/TDAP  10/20/2023   Hepatitis C Screening  Completed   HIV Screening  Completed   HPV VACCINES  Aged Out    The following portions of the patient's history were reviewed and updated as appropriate: allergies, current medications, past family history, past medical history, past social history, past surgical history, and problem list.  Review of Systems A comprehensive review of systems was negative.   Objective:    BP (!) 110/55    Pulse 79    Ht 5\' 5"  (1.651 m)    Wt 116 lb (52.6 kg)    SpO2 100%    BMI 19.30 kg/m  General appearance: alert, cooperative, and appears stated age Head: Normocephalic, without obvious abnormality, atraumatic Eyes: conjunctivae/corneas clear. PERRL, EOM's intact. Fundi benign. Ears: normal TM's and external ear canals both ears Nose: Nares normal. Septum midline. Mucosa normal. No drainage or sinus tenderness. Throat: lips, mucosa, and  tongue normal; teeth and gums normal Neck: no adenopathy, no carotid bruit, no JVD, supple, symmetrical, trachea midline, and thyroid not enlarged, symmetric, no tenderness/mass/nodules Back: symmetric, no curvature. ROM normal. No CVA tenderness. Lungs: clear to auscultation bilaterally Heart: regular rate and rhythm, S1, S2 normal, no murmur, click, rub or gallop Abdomen: soft, non-tender; bowel sounds normal; no masses,  no organomegaly Extremities: extremities normal, atraumatic, no cyanosis or edema Pulses: 2+ and symmetric Skin: Skin color, texture, turgor normal. No rashes or lesions Lymph nodes: Cervical, supraclavicular, and axillary nodes normal. Neurologic: Alert and oriented X 3, normal strength and tone. Normal symmetric reflexes. Normal coordination and gait   .[name] Depression screen [facility] 2/9 02/16/2020 01/27/2019 01/20/2018 06/04/2016  Decreased Interest 0 0 0 0  Down, Depressed, Hopeless 0 0 0 0  PHQ - 2 Score 0 0 0 0  Altered sleeping - 0 - -  Tired, decreased energy - 1 - -  Change in appetite - 0 - -  Feeling bad or failure about yourself  - 0 - -  Trouble concentrating - 0 - -  Moving slowly or fidgety/restless - 0 - -  Suicidal thoughts - 0 - -  PHQ-9 Score - 1 - -  Difficult doing work/chores - Not difficult at all - -    Assessment:    Healthy female exam.  Plan:    ..[place] was seen today for annual exam.  Diagnoses and all orders for this visit:  Routine physical examination -     TSH -     Lipid Panel w/reflex Direct LDL -     COMPLETE METABOLIC PANEL WITH GFR -     CBC with Differential/Platelet -     Hemoglobin A1c  Screening for diabetes mellitus -     COMPLETE METABOLIC PANEL WITH GFR -     Hemoglobin A1c  Screening for lipid disorders -     Lipid Panel w/reflex Direct LDL  Thyroid disorder screen -     TSH  Need for influenza vaccination -     Flu Vaccine QUAD 6+ mos PF IM (Fluarix Quad PF)   .[name] Discussed 150 minutes of exercise a  week.  Encouraged vitamin D 1000 units and Calcium 1300mg  or 4 servings of dairy a day.  Fasting labs ordered today.  PHQ no concerns.  Mammogram scheduled with [place], GYN.  Pap UTD.  Flu shot given today.   See After Visit Summary for Counseling Recommendations

## 2021-02-19 NOTE — Patient Instructions (Signed)

## 2021-02-20 LAB — CBC WITH DIFFERENTIAL/PLATELET
Absolute Monocytes: 422 cells/uL (ref 200–950)
Basophils Absolute: 29 cells/uL (ref 0–200)
Basophils Relative: 0.5 %
Eosinophils Absolute: 91 cells/uL (ref 15–500)
Eosinophils Relative: 1.6 %
HCT: 42.1 % (ref 35.0–45.0)
Hemoglobin: 13.7 g/dL (ref 11.7–15.5)
Lymphs Abs: 1921 cells/uL (ref 850–3900)
MCH: 29.9 pg (ref 27.0–33.0)
MCHC: 32.5 g/dL (ref 32.0–36.0)
MCV: 91.9 fL (ref 80.0–100.0)
MPV: 11.1 fL (ref 7.5–12.5)
Monocytes Relative: 7.4 %
Neutro Abs: 3238 cells/uL (ref 1500–7800)
Neutrophils Relative %: 56.8 %
Platelets: 281 10*3/uL (ref 140–400)
RBC: 4.58 10*6/uL (ref 3.80–5.10)
RDW: 12.5 % (ref 11.0–15.0)
Total Lymphocyte: 33.7 %
WBC: 5.7 10*3/uL (ref 3.8–10.8)

## 2021-02-20 LAB — LIPID PANEL W/REFLEX DIRECT LDL
Cholesterol: 147 mg/dL (ref <200)
HDL: 68 mg/dL (ref >=50)
LDL Cholesterol (Calc): 67 mg/dL (calc)
Non-HDL Cholesterol (Calc): 79 mg/dL (calc) (ref <130)
Total CHOL/HDL Ratio: 2.2 (calc) (ref <5.0)
Triglycerides: 41 mg/dL (ref <150)

## 2021-02-20 LAB — COMPLETE METABOLIC PANEL WITH GFR
AG Ratio: 1.7 (calc) (ref 1.0–2.5)
ALT: 12 U/L (ref 6–29)
AST: 13 U/L (ref 10–30)
Albumin: 4.4 g/dL (ref 3.6–5.1)
Alkaline phosphatase (APISO): 46 U/L (ref 31–125)
BUN: 13 mg/dL (ref 7–25)
CO2: 29 mmol/L (ref 20–32)
Calcium: 9.4 mg/dL (ref 8.6–10.2)
Chloride: 106 mmol/L (ref 98–110)
Creat: 0.93 mg/dL (ref 0.50–0.99)
Globulin: 2.6 g/dL (calc) (ref 1.9–3.7)
Glucose, Bld: 92 mg/dL (ref 65–99)
Potassium: 4.3 mmol/L (ref 3.5–5.3)
Sodium: 141 mmol/L (ref 135–146)
Total Bilirubin: 0.6 mg/dL (ref 0.2–1.2)
Total Protein: 7 g/dL (ref 6.1–8.1)
eGFR: 78 mL/min/{1.73_m2} (ref >=60)

## 2021-02-20 LAB — HEMOGLOBIN A1C
Hgb A1c MFr Bld: 5.3 % of total Hgb (ref <5.7)
Mean Plasma Glucose: 105 mg/dL
eAG (mmol/L): 5.8 mmol/L

## 2021-02-20 LAB — TSH: TSH: 1.62 mIU/L

## 2021-02-20 NOTE — Progress Notes (Signed)
Thyroid looks great.  Cholesterol looks fabulous.  Kidney, liver, glucose look wonderful.  A1C normal at 5.3. stable from 1 year ago.  Normal hemoglobin.   Labs look great. Will complete insurance form and fax today.

## 2021-03-20 IMAGING — CT CT RENAL STONE PROTOCOL
2 of 4 series · 16 of 46 positions shown, 18 images · non-contrast
Comparison: No priors.

CLINICAL DATA: 44-year-old female with history of left-sided flank
pain and hematuria 4 times over the past year.

EXAM:
CT ABDOMEN AND PELVIS WITHOUT CONTRAST
TECHNIQUE: Multidetector CT imaging of the abdomen and pelvis was performed
following the standard protocol without IV contrast.

[Series 2: axial st · axial · 0.80mm/px · z∈[-482,-106]mm · 13 of 83 slices shown, 15 images]
[im 4/83  soft-tissue]
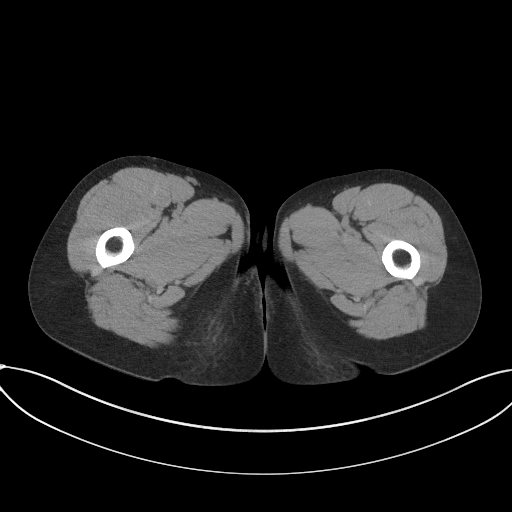
[im 4/83  bone]
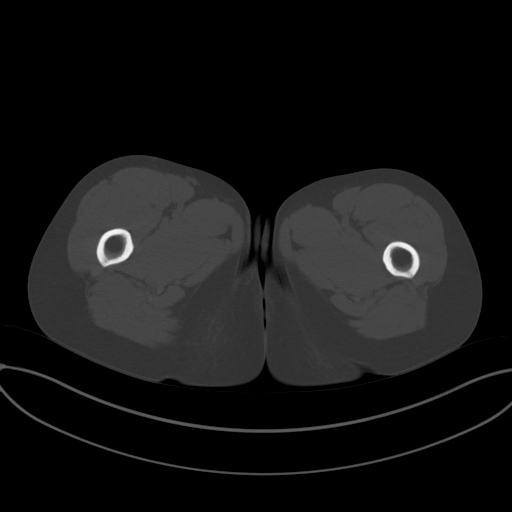
[im 10/83  soft-tissue]
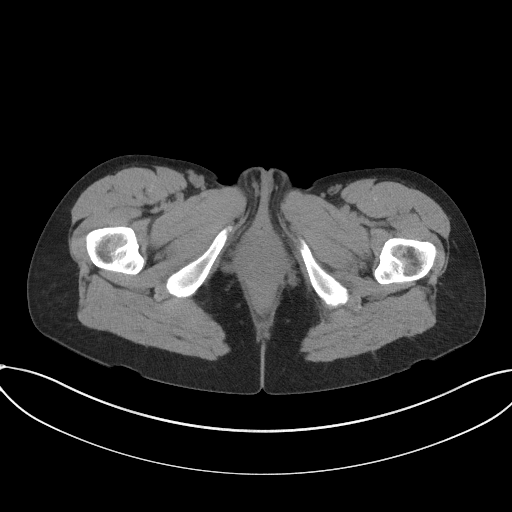
[im 17/83  soft-tissue]
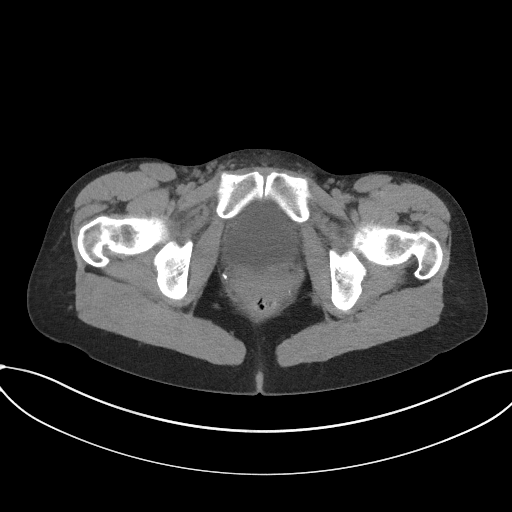
[im 23/83  soft-tissue]
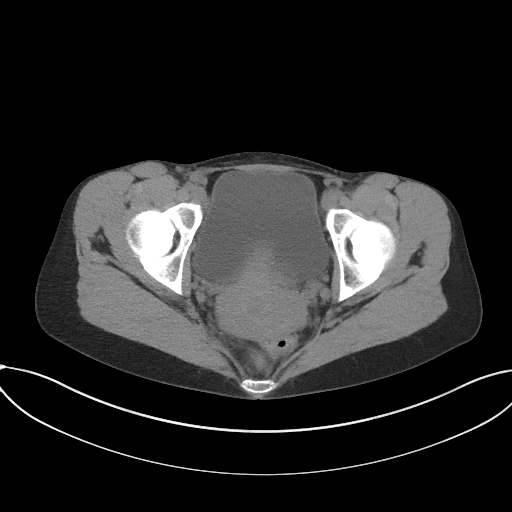
[im 30/83  soft-tissue]
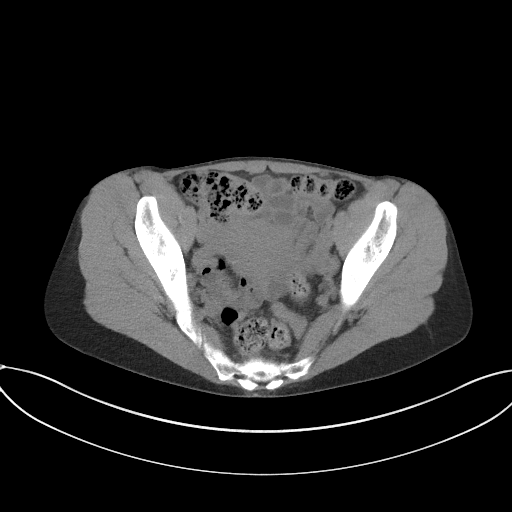
[im 37/83  soft-tissue]
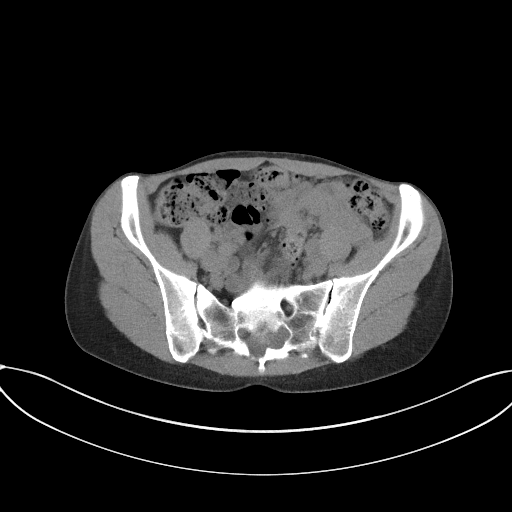
[im 43/83  soft-tissue]
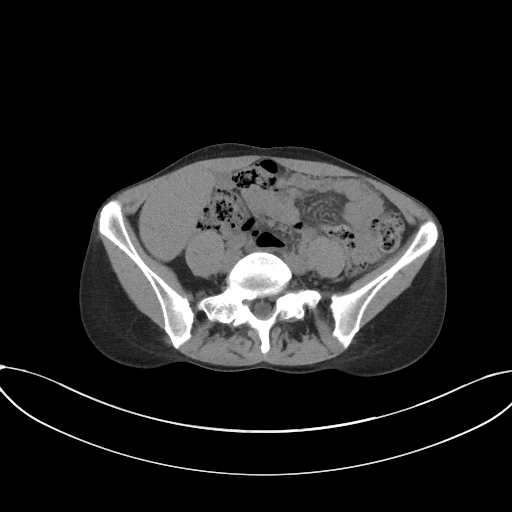
[im 46/83  soft-tissue]
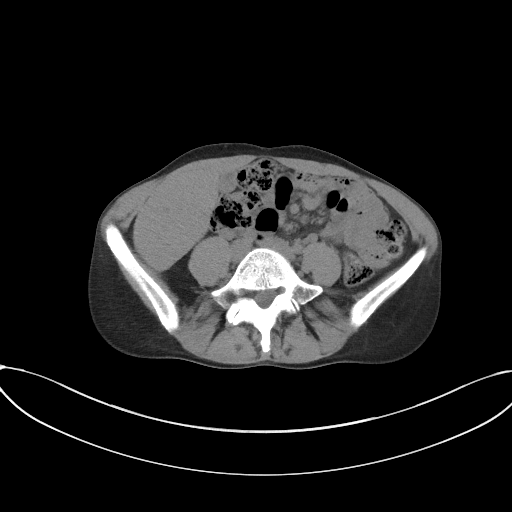
[im 53/83  soft-tissue]
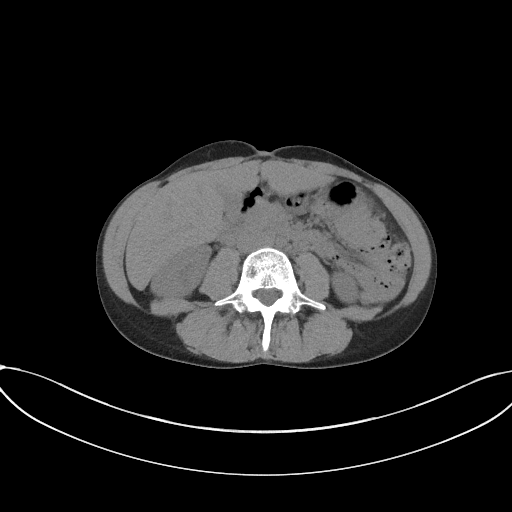
[im 53/83  bone]
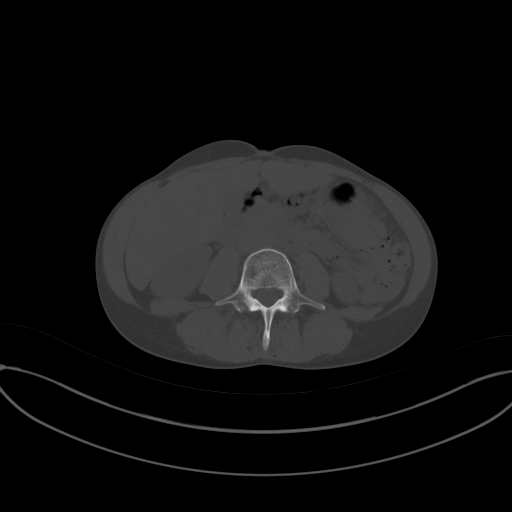
[im 60/83  soft-tissue]
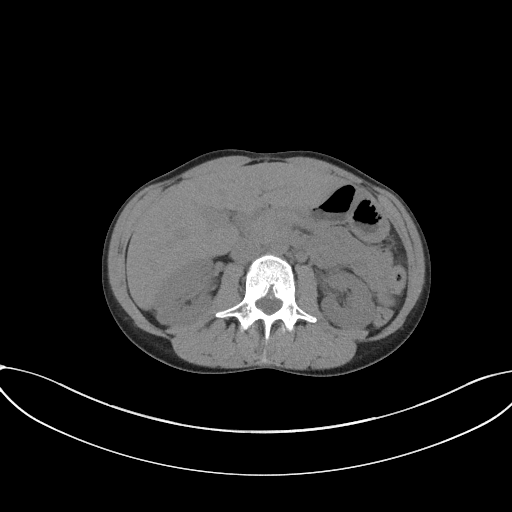
[im 66/83  soft-tissue]
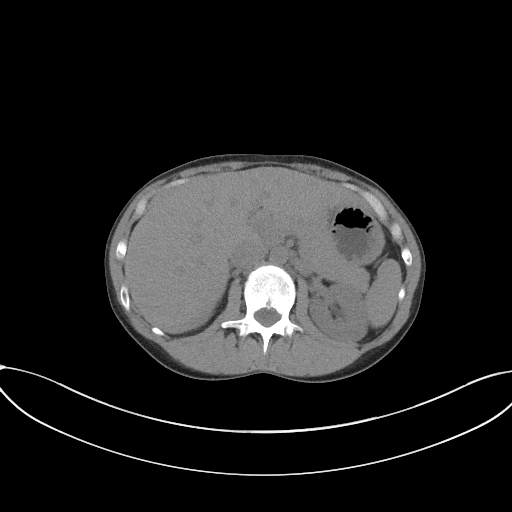
[im 73/83  soft-tissue]
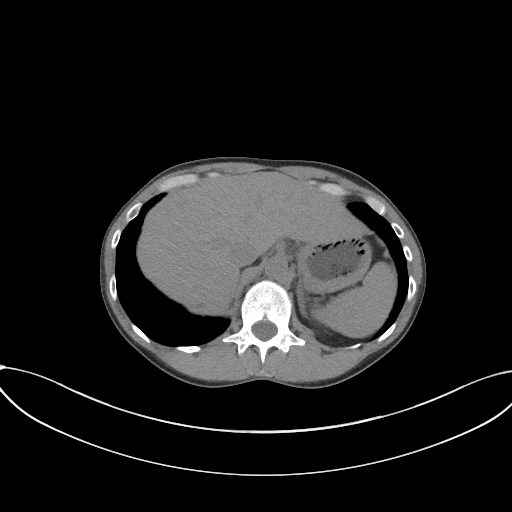
[im 79/83  soft-tissue]
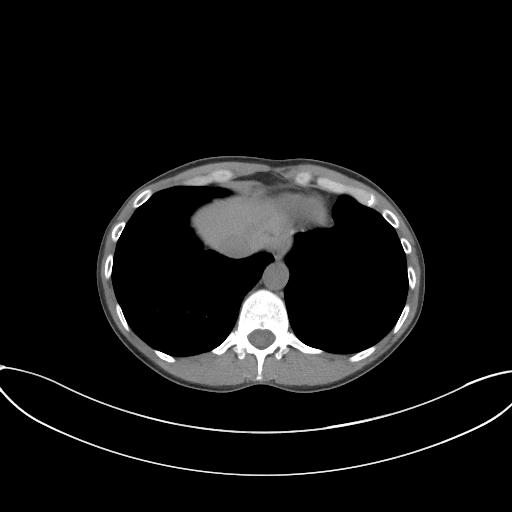

[Series 5: coronal st · coronal · 0.71mm/px · 3 of 72 slices shown]
[im 24/72  soft-tissue]
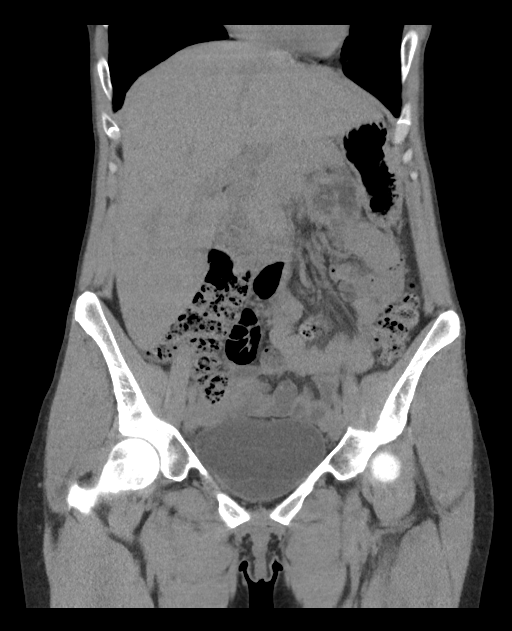
[im 32/72  soft-tissue]
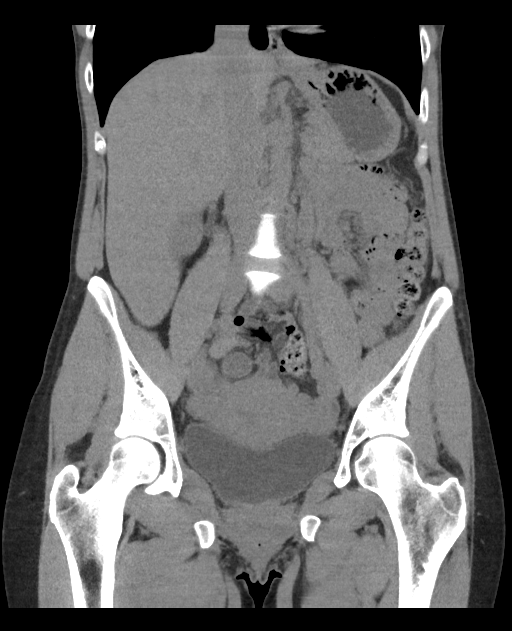
[im 40/72  soft-tissue]
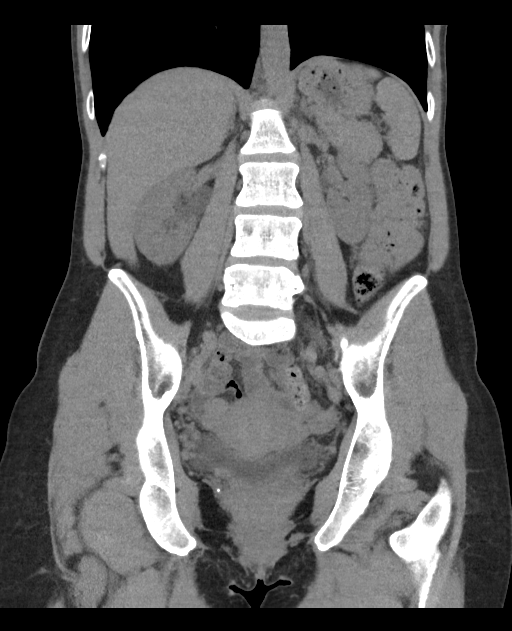

[16 of 46 positions shown; findings below may reference images not displayed]

FINDINGS: Lower chest: Unremarkable.

Hepatobiliary: No definite suspicious cystic or solid hepatic
lesions are confidently identified on today's noncontrast CT
examination. Unenhanced appearance of the gallbladder is normal.

Pancreas: No definite pancreatic mass or peripancreatic fluid
collections or inflammatory changes are noted on today's noncontrast
CT examination.

Spleen: Unremarkable.

Adrenals/Urinary Tract: No calcifications are identified within the
collecting system of either kidney, along the course of either
ureter, or within the lumen of the urinary bladder. No
hydroureteronephrosis. Urinary bladder is normal in appearance.

Stomach/Bowel: Unenhanced appearance of the stomach is normal. No
pathologic dilatation of small bowel or colon. The appendix is not
confidently identified and may be surgically absent. Regardless,
there are no inflammatory changes noted adjacent to the cecum to
suggest the presence of an acute appendicitis at this time.

Vascular/Lymphatic: No atherosclerotic calcifications in the
abdominal aorta or pelvic vasculature. No lymphadenopathy noted in
the abdomen or pelvis.

Reproductive: Unenhanced appearance of the uterus and ovaries is
unremarkable.

Other: No significant volume of ascites.  No pneumoperitoneum.

Musculoskeletal: There are no aggressive appearing lytic or blastic
lesions noted in the visualized portions of the skeleton.
IMPRESSION: 1. No acute findings are noted in the abdomen or pelvis to account
for the patient's symptoms. Specifically, no urinary tract calculi
no findings of urinary tract obstruction at this time.

## 2022-02-06 LAB — HM MAMMOGRAPHY

## 2023-02-10 ENCOUNTER — Ambulatory Visit (INDEPENDENT_AMBULATORY_CARE_PROVIDER_SITE_OTHER): Payer: 59 | Admitting: Physician Assistant

## 2023-02-10 ENCOUNTER — Encounter: Payer: Self-pay | Admitting: Physician Assistant

## 2023-02-10 VITALS — BP 111/68 | HR 100 | Ht 65.0 in | Wt 117.0 lb

## 2023-02-10 DIAGNOSIS — Z131 Encounter for screening for diabetes mellitus: Secondary | ICD-10-CM

## 2023-02-10 DIAGNOSIS — Z0000 Encounter for general adult medical examination without abnormal findings: Secondary | ICD-10-CM

## 2023-02-10 DIAGNOSIS — Z1329 Encounter for screening for other suspected endocrine disorder: Secondary | ICD-10-CM

## 2023-02-10 DIAGNOSIS — Z23 Encounter for immunization: Secondary | ICD-10-CM | POA: Diagnosis not present

## 2023-02-10 DIAGNOSIS — Z13220 Encounter for screening for lipoid disorders: Secondary | ICD-10-CM | POA: Diagnosis not present

## 2023-02-10 DIAGNOSIS — Z1211 Encounter for screening for malignant neoplasm of colon: Secondary | ICD-10-CM

## 2023-02-10 NOTE — Progress Notes (Signed)
Complete physical exam  Patient: Marcia Lara   DOB: 1975/05/09   48 y.o. Female  MRN: [id number]  Subjective:    Chief Complaint  Patient presents with   Annual Exam    Marcia Lara is a 48 y.o. female who presents today for a complete physical exam. She reports consuming a general diet.  She walks 2-3 times a week.  She generally feels well. She reports sleeping well. She does have additional problems to discuss today. She does have occasionally some "white specs in stool". No melena or hematochezia.    Most recent fall risk assessment:    02/10/2023    9:13 AM  Fall Risk   Falls in the past year? 1  Number falls in past yr: 0  Injury with Fall? 0     Most recent depression screenings:    02/10/2023    9:13 AM 02/16/2020    9:28 AM  PHQ 2/9 Scores  PHQ - 2 Score 0 0    Vision:Within last year and Dental: No current dental problems and Receives regular dental care  Patient Active Problem List   Diagnosis Date Noted   Left flank pain 06/08/2019   Gross hematuria 06/08/2019   Eye muscle twitches 06/04/2016   Lump on neck 06/04/2016   No past medical history on file. Past Surgical History:  Procedure Laterality Date   WISDOM TOOTH EXTRACTION  1997   Family History  Problem Relation Age of Onset   Thyroid cancer Maternal Grandmother    Hypertension Maternal Grandmother    Depression Mother    Hypertension Mother    Hypertension Unknown    Hyperlipidemia Unknown    Diabetes Father    No Known Allergies    Patient Care Team: Nolene Ebbs as PCP - General (Family Medicine)   No outpatient medications prior to visit.   No facility-administered medications prior to visit.    Review of Systems  All other systems reviewed and are negative.         Objective:     BP 111/68   Pulse 100   Ht 5\' 5"  (1.651 m)   Wt 117 lb (53.1 kg)   SpO2 99%   BMI 19.47 kg/m  BP Readings from Last 3 Encounters:  02/10/23 111/68  02/19/21  (!) 110/55  02/16/20 (!) 113/54   Wt Readings from Last 3 Encounters:  02/10/23 117 lb (53.1 kg)  02/19/21 116 lb (52.6 kg)  02/16/20 113 lb (51.3 kg)      Physical Exam  BP 111/68   Pulse 100   Ht 5\' 5"  (1.651 m)   Wt 117 lb (53.1 kg)   SpO2 99%   BMI 19.47 kg/m   General Appearance:    Alert, cooperative, no distress, appears stated age  Head:    Normocephalic, without obvious abnormality, atraumatic  Eyes:    PERRL, conjunctiva/corneas clear, EOM's intact, fundi    benign, both eyes  Ears:    Normal TM's and external ear canals, both ears  Nose:   Nares normal, septum midline, mucosa normal, no drainage    or sinus tenderness  Throat:   Lips, mucosa, and tongue normal; teeth and gums normal  Neck:   Supple, symmetrical, trachea midline, no adenopathy;    thyroid:  no enlargement/tenderness/nodules; no carotid   bruit or JVD  Back:     Symmetric, no curvature, ROM normal, no CVA tenderness  Lungs:     Clear to auscultation bilaterally, respirations unlabored  Chest Wall:    No tenderness or deformity   Heart:    Regular rate and rhythm, S1 and S2 normal, no murmur, rub   or gallop     Abdomen:     Soft, non-tender, bowel sounds active all four quadrants,    no masses, no organomegaly        Extremities:   Extremities normal, atraumatic, no cyanosis or edema  Pulses:   2+ and symmetric all extremities  Skin:   Skin color, texture, turgor normal, no rashes or lesions  Lymph nodes:   Cervical, supraclavicular, and axillary nodes normal  Neurologic:   CNII-XII intact, normal strength, sensation and reflexes    throughout       Assessment & Plan:    Routine Health Maintenance and Physical Exam  Immunization History  Administered Date(s) Administered   Influenza Whole 09/13/2009   Influenza, Seasonal, Injecte, Preservative Fre 10/19/2013, 02/10/2023   Influenza,inj,Quad PF,6+ Mos 09/08/2018, 02/19/2021   Tdap 08/02/2011, 10/19/2013    Health Maintenance  Topic  Date Due   MAMMOGRAM  02/12/2020   Colonoscopy  Never done   Cervical Cancer Screening (HPV/Pap Cotest)  02/28/2023   COVID-19 Vaccine (1 - 2024-25 season) 02/26/2023 (Originally 09/08/2022)   DTaP/Tdap/Td (3 - Td or Tdap) 10/20/2023   INFLUENZA VACCINE  Completed   Hepatitis C Screening  Completed   HIV Screening  Completed   HPV VACCINES  Aged Out    Discussed health benefits of physical activity, and encouraged her to engage in regular exercise appropriate for her age and condition.  Marcia Lara was seen today for annual exam.  Diagnoses and all orders for this visit:  Routine physical examination -     Lipid panel -     CMP14+EGFR -     Hemoglobin A1c -     CBC w/Diff/Platelet -     TSH  Colon cancer screening -     Ambulatory referral to Gastroenterology  Screening for diabetes mellitus -     CMP14+EGFR  Screening for lipid disorders -     Lipid panel  Thyroid disorder screening -     TSH  Immunization due -     Flu vaccine trivalent PF, 6mos and older(Flulaval,Afluria,Fluarix,Fluzone)   Discussed 150 minutes of exercise a week.  Encouraged vitamin D 1000 units and Calcium 1300mg  or 4 servings of dairy a day.  PHQ no concerns Vitals looks great Fasting labs ordered today Ordered colonoscopy Mammogram and pap smear per patient are done we faxed to get records Flu shot given today    Return in about 1 year (around 02/10/2024).     Tandy Gaw, PA-C

## 2023-02-10 NOTE — Patient Instructions (Addendum)
Will order colonoscopy  Health Maintenance, Female Adopting a healthy lifestyle and getting preventive care are important in promoting health and wellness. Ask your health care provider about: The right schedule for you to have regular tests and exams. Things you can do on your own to prevent diseases and keep yourself healthy. What should I know about diet, weight, and exercise? Eat a healthy diet  Eat a diet that includes plenty of vegetables, fruits, low-fat dairy products, and lean protein. Do not eat a lot of foods that are high in solid fats, added sugars, or sodium. Maintain a healthy weight Body mass index (BMI) is used to identify weight problems. It estimates body fat based on height and weight. Your health care provider can help determine your BMI and help you achieve or maintain a healthy weight. Get regular exercise Get regular exercise. This is one of the most important things you can do for your health. Most adults should: Exercise for at least 150 minutes each week. The exercise should increase your heart rate and make you sweat (moderate-intensity exercise). Do strengthening exercises at least twice a week. This is in addition to the moderate-intensity exercise. Spend less time sitting. Even light physical activity can be beneficial. Watch cholesterol and blood lipids Have your blood tested for lipids and cholesterol at 47 years of age, then have this test every 5 years. Have your cholesterol levels checked more often if: Your lipid or cholesterol levels are high. You are older than 46 years of age. You are at high risk for heart disease. What should I know about cancer screening? Depending on your health history and family history, you may need to have cancer screening at various ages. This may include screening for: Breast cancer. Cervical cancer. Colorectal cancer. Skin cancer. Lung cancer. What should I know about heart disease, diabetes, and high blood  pressure? Blood pressure and heart disease High blood pressure causes heart disease and increases the risk of stroke. This is more likely to develop in people who have high blood pressure readings or are overweight. Have your blood pressure checked: Every 3-5 years if you are 62-1 years of age. Every year if you are 78 years old or older. Diabetes Have regular diabetes screenings. This checks your fasting blood sugar level. Have the screening done: Once every three years after age 35 if you are at a normal weight and have a low risk for diabetes. More often and at a younger age if you are overweight or have a high risk for diabetes. What should I know about preventing infection? Hepatitis B If you have a higher risk for hepatitis B, you should be screened for this virus. Talk with your health care provider to find out if you are at risk for hepatitis B infection. Hepatitis C Testing is recommended for: Everyone born from [birth year] through 1963. Anyone with known risk factors for hepatitis C. Sexually transmitted infections (STIs) Get screened for STIs, including gonorrhea and chlamydia, if: You are sexually active and are younger than 46 years of age. You are older than 46 years of age and your health care provider tells you that you are at risk for this type of infection. Your sexual activity has changed since you were last screened, and you are at increased risk for chlamydia or gonorrhea. Ask your health care provider if you are at risk. Ask your health care provider about whether you are at high risk for HIV. Your health care provider may recommend a prescription medicine  to help prevent HIV infection. If you choose to take medicine to prevent HIV, you should first get tested for HIV. You should then be tested every 3 months for as long as you are taking the medicine. Pregnancy If you are about to stop having your period (premenopausal) and you may become pregnant, seek counseling before you  get pregnant. Take 400 to 800 micrograms (mcg) of folic acid every day if you become pregnant. Ask for birth control (contraception) if you want to prevent pregnancy. Osteoporosis and menopause Osteoporosis is a disease in which the bones lose minerals and strength with aging. This can result in bone fractures. If you are 37 years old or older, or if you are at risk for osteoporosis and fractures, ask your health care provider if you should: Be screened for bone loss. Take a calcium or vitamin D supplement to lower your risk of fractures. Be given hormone replacement therapy (HRT) to treat symptoms of menopause. Follow these instructions at home: Alcohol use Do not drink alcohol if: Your health care provider tells you not to drink. You are pregnant, may be pregnant, or are planning to become pregnant. If you drink alcohol: Limit how much you have to: 0-1 drink a day. Know how much alcohol is in your drink. In the [place] one drink equals one 12 oz bottle of beer (355 mL), one 5 oz glass of wine (148 mL), or one 1 oz glass of hard liquor (44 mL). Lifestyle Do not use any products that contain nicotine or tobacco. These products include cigarettes, chewing tobacco, and vaping devices, such as e-cigarettes. If you need help quitting, ask your health care provider. Do not use street drugs. Do not share needles. Ask your health care provider for help if you need support or information about quitting drugs. General instructions Schedule regular health, dental, and eye exams. Stay current with your vaccines. Tell your health care provider if: You often feel depressed. You have ever been abused or do not feel safe at home. Summary Adopting a healthy lifestyle and getting preventive care are important in promoting health and wellness. Follow your health care provider's instructions about healthy diet, exercising, and getting tested or screened for diseases. Follow your health care provider's  instructions on monitoring your cholesterol and blood pressure. This information is not intended to replace advice given to you by your health care provider. Make sure you discuss any questions you have with your health care provider. Document Revised: 05/15/2020 Document Reviewed: 05/15/2020 Elsevier Patient Education  2024 [employer].

## 2023-02-11 ENCOUNTER — Encounter: Payer: Self-pay | Admitting: Physician Assistant

## 2023-02-11 DIAGNOSIS — R7303 Prediabetes: Secondary | ICD-10-CM | POA: Insufficient documentation

## 2023-02-11 LAB — CBC WITH DIFFERENTIAL/PLATELET
Basophils Absolute: 0 10*3/uL (ref 0.0–0.2)
Basos: 1 %
EOS (ABSOLUTE): 0.1 10*3/uL (ref 0.0–0.4)
Eos: 1 %
Hematocrit: 41.4 % (ref 34.0–46.6)
Hemoglobin: 13.7 g/dL (ref 11.1–15.9)
Immature Grans (Abs): 0 10*3/uL (ref 0.0–0.1)
Immature Granulocytes: 0 %
Lymphocytes Absolute: 1.7 10*3/uL (ref 0.7–3.1)
Lymphs: 28 %
MCH: 30.2 pg (ref 26.6–33.0)
MCHC: 33.1 g/dL (ref 31.5–35.7)
MCV: 91 fL (ref 79–97)
Monocytes Absolute: 0.5 10*3/uL (ref 0.1–0.9)
Monocytes: 7 %
Neutrophils Absolute: 3.9 10*3/uL (ref 1.4–7.0)
Neutrophils: 63 %
Platelets: 284 10*3/uL (ref 150–450)
RBC: 4.54 x10E6/uL (ref 3.77–5.28)
RDW: 12.6 % (ref 11.7–15.4)
WBC: 6.2 10*3/uL (ref 3.4–10.8)

## 2023-02-11 LAB — CMP14+EGFR
ALT: 12 [IU]/L (ref 0–32)
AST: 20 [IU]/L (ref 0–40)
Albumin: 4.6 g/dL (ref 3.9–4.9)
Alkaline Phosphatase: 58 [IU]/L (ref 44–121)
BUN/Creatinine Ratio: 16 (ref 9–23)
BUN: 13 mg/dL (ref 6–24)
Bilirubin Total: 0.5 mg/dL (ref 0.0–1.2)
CO2: 22 mmol/L (ref 20–29)
Calcium: 9 mg/dL (ref 8.7–10.2)
Chloride: 104 mmol/L (ref 96–106)
Creatinine, Ser: 0.79 mg/dL (ref 0.57–1.00)
Globulin, Total: 2.1 g/dL (ref 1.5–4.5)
Glucose: 90 mg/dL (ref 70–99)
Potassium: 4.4 mmol/L (ref 3.5–5.2)
Sodium: 141 mmol/L (ref 134–144)
Total Protein: 6.7 g/dL (ref 6.0–8.5)
eGFR: 94 mL/min/{1.73_m2} (ref >59)

## 2023-02-11 LAB — LIPID PANEL
Chol/HDL Ratio: 2.1 {ratio} (ref 0.0–4.4)
Cholesterol, Total: 143 mg/dL (ref 100–199)
HDL: 69 mg/dL (ref >39)
LDL Chol Calc (NIH): 64 mg/dL (ref 0–99)
Triglycerides: 44 mg/dL (ref 0–149)
VLDL Cholesterol Cal: 10 mg/dL (ref 5–40)

## 2023-02-11 LAB — HEMOGLOBIN A1C
Est. average glucose Bld gHb Est-mCnc: 117 mg/dL
Hgb A1c MFr Bld: 5.7 % — ABNORMAL HIGH (ref 4.8–5.6)

## 2023-02-11 LAB — TSH: TSH: 1.39 u[IU]/mL (ref 0.450–4.500)

## 2023-02-11 NOTE — Progress Notes (Signed)
Lat[place]ia,   A1C is up from 1 year ago and in the pre-diabetes range.  You do need to limit sugars and carbs and try to combine with protein.  Recheck in 6 months.  Cholesterol looks great! Kidney, liver, glucose look great.  Thyroid normal.   Francesco Runner please fill out work form and fax for patient.

## 2023-02-20 ENCOUNTER — Telehealth: Payer: Self-pay

## 2023-02-20 NOTE — Telephone Encounter (Signed)
Fax confirmation  of pts biometrics

## 2023-05-05 LAB — HM COLONOSCOPY

## 2023-11-14 LAB — HM MAMMOGRAPHY
